# Patient Record
Sex: Male | Born: 1970 | Race: Black or African American | Hispanic: No | Marital: Married | Smoking: Current every day smoker
Health system: Southern US, Community
[De-identification: ages and names within clinical notes are randomized; demographics above are authoritative.]

## PROBLEM LIST (undated history)

## (undated) DIAGNOSIS — C801 Malignant (primary) neoplasm, unspecified: Secondary | ICD-10-CM

## (undated) DIAGNOSIS — F528 Other sexual dysfunction not due to a substance or known physiological condition: Secondary | ICD-10-CM

## (undated) DIAGNOSIS — E291 Testicular hypofunction: Secondary | ICD-10-CM

## (undated) DIAGNOSIS — K219 Gastro-esophageal reflux disease without esophagitis: Secondary | ICD-10-CM

## (undated) DIAGNOSIS — E782 Mixed hyperlipidemia: Secondary | ICD-10-CM

## (undated) DIAGNOSIS — A6000 Herpesviral infection of urogenital system, unspecified: Secondary | ICD-10-CM

## (undated) HISTORY — DX: Gastro-esophageal reflux disease without esophagitis: K21.9

## (undated) HISTORY — DX: Other sexual dysfunction not due to a substance or known physiological condition: F52.8

## (undated) HISTORY — PX: FOOT SURGERY: SHX648

## (undated) HISTORY — DX: Testicular hypofunction: E29.1

## (undated) HISTORY — DX: Herpesviral infection of urogenital system, unspecified: A60.00

## (undated) HISTORY — DX: Mixed hyperlipidemia: E78.2

## (undated) HISTORY — PX: OTHER SURGICAL HISTORY: SHX169

---

## 2001-06-10 ENCOUNTER — Emergency Department (HOSPITAL_COMMUNITY): Admission: EM | Admit: 2001-06-10 | Discharge: 2001-06-10 | Payer: Self-pay | Admitting: Emergency Medicine

## 2004-09-08 ENCOUNTER — Emergency Department (HOSPITAL_COMMUNITY): Admission: EM | Admit: 2004-09-08 | Discharge: 2004-09-09 | Payer: Self-pay | Admitting: Emergency Medicine

## 2004-10-16 ENCOUNTER — Ambulatory Visit (HOSPITAL_COMMUNITY): Admission: RE | Admit: 2004-10-16 | Discharge: 2004-10-16 | Payer: Self-pay | Admitting: Urology

## 2004-12-15 DIAGNOSIS — C8190 Hodgkin lymphoma, unspecified, unspecified site: Secondary | ICD-10-CM

## 2004-12-15 HISTORY — DX: Hodgkin lymphoma, unspecified, unspecified site: C81.90

## 2005-08-28 ENCOUNTER — Encounter: Admission: RE | Admit: 2005-08-28 | Discharge: 2005-08-28 | Payer: Self-pay | Admitting: Occupational Medicine

## 2005-09-12 ENCOUNTER — Ambulatory Visit: Payer: Self-pay | Admitting: Internal Medicine

## 2005-09-17 ENCOUNTER — Other Ambulatory Visit: Admission: RE | Admit: 2005-09-17 | Discharge: 2005-09-17 | Payer: Self-pay | Admitting: Otolaryngology

## 2005-09-28 DIAGNOSIS — C801 Malignant (primary) neoplasm, unspecified: Secondary | ICD-10-CM

## 2005-09-28 HISTORY — DX: Malignant (primary) neoplasm, unspecified: C80.1

## 2005-09-29 ENCOUNTER — Encounter (INDEPENDENT_AMBULATORY_CARE_PROVIDER_SITE_OTHER): Payer: Self-pay | Admitting: *Deleted

## 2005-09-29 ENCOUNTER — Ambulatory Visit (HOSPITAL_COMMUNITY): Admission: RE | Admit: 2005-09-29 | Discharge: 2005-09-29 | Payer: Self-pay | Admitting: Otolaryngology

## 2005-09-29 ENCOUNTER — Ambulatory Visit (HOSPITAL_BASED_OUTPATIENT_CLINIC_OR_DEPARTMENT_OTHER): Admission: RE | Admit: 2005-09-29 | Discharge: 2005-09-29 | Payer: Self-pay | Admitting: Otolaryngology

## 2005-10-03 ENCOUNTER — Ambulatory Visit: Payer: Self-pay | Admitting: Hematology and Oncology

## 2005-10-07 ENCOUNTER — Ambulatory Visit: Admission: RE | Admit: 2005-10-07 | Discharge: 2005-10-20 | Payer: Self-pay | Admitting: Radiation Oncology

## 2005-10-10 ENCOUNTER — Ambulatory Visit (HOSPITAL_COMMUNITY): Admission: RE | Admit: 2005-10-10 | Discharge: 2005-10-10 | Payer: Self-pay | Admitting: Hematology and Oncology

## 2005-10-13 ENCOUNTER — Ambulatory Visit (HOSPITAL_COMMUNITY): Admission: RE | Admit: 2005-10-13 | Discharge: 2005-10-13 | Payer: Self-pay | Admitting: Hematology and Oncology

## 2005-10-14 ENCOUNTER — Other Ambulatory Visit: Admission: RE | Admit: 2005-10-14 | Discharge: 2005-10-14 | Payer: Self-pay | Admitting: Hematology and Oncology

## 2005-10-14 ENCOUNTER — Encounter (INDEPENDENT_AMBULATORY_CARE_PROVIDER_SITE_OTHER): Payer: Self-pay | Admitting: *Deleted

## 2005-10-15 ENCOUNTER — Ambulatory Visit (HOSPITAL_COMMUNITY): Admission: RE | Admit: 2005-10-15 | Discharge: 2005-10-15 | Payer: Self-pay | Admitting: Hematology and Oncology

## 2005-11-25 ENCOUNTER — Ambulatory Visit: Payer: Self-pay | Admitting: Hematology and Oncology

## 2006-01-20 ENCOUNTER — Ambulatory Visit: Payer: Self-pay | Admitting: Hematology and Oncology

## 2006-02-23 ENCOUNTER — Ambulatory Visit: Admission: RE | Admit: 2006-02-23 | Discharge: 2006-05-24 | Payer: Self-pay | Admitting: Radiation Oncology

## 2006-03-02 ENCOUNTER — Ambulatory Visit: Payer: Self-pay | Admitting: Dentistry

## 2006-03-02 ENCOUNTER — Encounter: Admission: RE | Admit: 2006-03-02 | Discharge: 2006-03-02 | Payer: Self-pay | Admitting: Dentistry

## 2006-03-04 ENCOUNTER — Ambulatory Visit (HOSPITAL_COMMUNITY): Admission: RE | Admit: 2006-03-04 | Discharge: 2006-03-04 | Payer: Self-pay | Admitting: Radiation Oncology

## 2006-03-12 ENCOUNTER — Ambulatory Visit (HOSPITAL_COMMUNITY): Admission: RE | Admit: 2006-03-12 | Discharge: 2006-03-12 | Payer: Self-pay | Admitting: Thoracic Surgery

## 2006-03-13 ENCOUNTER — Ambulatory Visit (HOSPITAL_COMMUNITY): Admission: RE | Admit: 2006-03-13 | Discharge: 2006-03-13 | Payer: Self-pay | Admitting: Thoracic Surgery

## 2006-03-13 ENCOUNTER — Encounter (INDEPENDENT_AMBULATORY_CARE_PROVIDER_SITE_OTHER): Payer: Self-pay | Admitting: *Deleted

## 2006-04-02 ENCOUNTER — Encounter (INDEPENDENT_AMBULATORY_CARE_PROVIDER_SITE_OTHER): Payer: Self-pay | Admitting: Specialist

## 2006-04-02 ENCOUNTER — Inpatient Hospital Stay (HOSPITAL_COMMUNITY): Admission: RE | Admit: 2006-04-02 | Discharge: 2006-04-06 | Payer: Self-pay | Admitting: Thoracic Surgery

## 2006-04-03 ENCOUNTER — Ambulatory Visit: Payer: Self-pay | Admitting: Hematology and Oncology

## 2006-04-13 ENCOUNTER — Encounter: Admission: RE | Admit: 2006-04-13 | Discharge: 2006-04-13 | Payer: Self-pay | Admitting: Thoracic Surgery

## 2006-04-16 ENCOUNTER — Ambulatory Visit: Payer: Self-pay | Admitting: Hematology and Oncology

## 2006-04-16 LAB — LACTATE DEHYDROGENASE: LDH: 175 U/L (ref 94–250)

## 2006-04-16 LAB — CBC WITH DIFFERENTIAL/PLATELET
BASO%: 1 % (ref 0.0–2.0)
LYMPH%: 25.6 % (ref 14.0–48.0)
MCHC: 32.8 g/dL (ref 32.0–35.9)
MONO#: 0.4 10*3/uL (ref 0.1–0.9)
MONO%: 8.5 % (ref 0.0–13.0)
Platelets: 401 10*3/uL — ABNORMAL HIGH (ref 145–400)
RBC: 4.77 10*6/uL (ref 4.20–5.71)
RDW: 14.5 % (ref 11.2–14.6)
WBC: 5.3 10*3/uL (ref 4.0–10.0)

## 2006-04-16 LAB — COMPREHENSIVE METABOLIC PANEL
ALT: 38 U/L (ref 0–40)
Alkaline Phosphatase: 113 U/L (ref 39–117)
CO2: 27 mEq/L (ref 19–32)
Potassium: 4.9 mEq/L (ref 3.5–5.3)
Sodium: 141 mEq/L (ref 135–145)
Total Bilirubin: 0.3 mg/dL (ref 0.3–1.2)
Total Protein: 7.9 g/dL (ref 6.0–8.3)

## 2006-04-28 ENCOUNTER — Ambulatory Visit: Payer: Self-pay | Admitting: Critical Care Medicine

## 2006-04-29 ENCOUNTER — Encounter: Admission: RE | Admit: 2006-04-29 | Discharge: 2006-04-29 | Payer: Self-pay | Admitting: Thoracic Surgery

## 2006-05-04 ENCOUNTER — Ambulatory Visit: Payer: Self-pay | Admitting: Critical Care Medicine

## 2006-05-25 ENCOUNTER — Ambulatory Visit: Admission: RE | Admit: 2006-05-25 | Discharge: 2006-06-02 | Payer: Self-pay | Admitting: Hematology and Oncology

## 2006-07-15 ENCOUNTER — Encounter: Admission: RE | Admit: 2006-07-15 | Discharge: 2006-07-15 | Payer: Self-pay | Admitting: Thoracic Surgery

## 2006-08-19 ENCOUNTER — Ambulatory Visit: Payer: Self-pay | Admitting: Hematology and Oncology

## 2006-08-21 ENCOUNTER — Ambulatory Visit (HOSPITAL_COMMUNITY): Admission: RE | Admit: 2006-08-21 | Discharge: 2006-08-21 | Payer: Self-pay | Admitting: Hematology and Oncology

## 2006-08-21 LAB — COMPREHENSIVE METABOLIC PANEL
ALT: 40 U/L (ref 0–40)
AST: 33 U/L (ref 0–37)
Albumin: 4.7 g/dL (ref 3.5–5.2)
CO2: 24 mEq/L (ref 19–32)
Calcium: 9.9 mg/dL (ref 8.4–10.5)
Chloride: 106 mEq/L (ref 96–112)
Potassium: 4 mEq/L (ref 3.5–5.3)
Total Protein: 7.7 g/dL (ref 6.0–8.3)

## 2006-08-21 LAB — CBC WITH DIFFERENTIAL/PLATELET
BASO%: 0.7 % (ref 0.0–2.0)
EOS%: 1.1 % (ref 0.0–7.0)
Eosinophils Absolute: 0.1 10*3/uL (ref 0.0–0.5)
LYMPH%: 18.1 % (ref 14.0–48.0)
MCH: 28.3 pg (ref 28.0–33.4)
MCHC: 33.6 g/dL (ref 32.0–35.9)
MCV: 84.2 fL (ref 81.6–98.0)
MONO%: 8.9 % (ref 0.0–13.0)
Platelets: 259 10*3/uL (ref 145–400)
RBC: 4.98 10*6/uL (ref 4.20–5.71)
RDW: 16.1 % — ABNORMAL HIGH (ref 11.2–14.6)

## 2006-08-21 LAB — LACTATE DEHYDROGENASE: LDH: 195 U/L (ref 94–250)

## 2006-11-30 ENCOUNTER — Ambulatory Visit: Payer: Self-pay | Admitting: Hematology and Oncology

## 2006-11-30 ENCOUNTER — Ambulatory Visit (HOSPITAL_COMMUNITY): Admission: RE | Admit: 2006-11-30 | Discharge: 2006-11-30 | Payer: Self-pay | Admitting: Hematology and Oncology

## 2006-12-02 LAB — CBC WITH DIFFERENTIAL/PLATELET
Basophils Absolute: 0 10*3/uL (ref 0.0–0.1)
Eosinophils Absolute: 0.1 10*3/uL (ref 0.0–0.5)
HCT: 45.4 % (ref 38.7–49.9)
HGB: 15.2 g/dL (ref 13.0–17.1)
LYMPH%: 20.8 % (ref 14.0–48.0)
MCV: 85.2 fL (ref 81.6–98.0)
MONO%: 5.7 % (ref 0.0–13.0)
NEUT#: 3.2 10*3/uL (ref 1.5–6.5)
NEUT%: 70.6 % (ref 40.0–75.0)
Platelets: 251 10*3/uL (ref 145–400)

## 2006-12-02 LAB — TSH: TSH: 2.075 u[IU]/mL (ref 0.350–5.500)

## 2006-12-02 LAB — COMPREHENSIVE METABOLIC PANEL
Alkaline Phosphatase: 118 U/L — ABNORMAL HIGH (ref 39–117)
BUN: 21 mg/dL (ref 6–23)
Glucose, Bld: 106 mg/dL — ABNORMAL HIGH (ref 70–99)
Total Bilirubin: 0.5 mg/dL (ref 0.3–1.2)

## 2006-12-02 LAB — T4, FREE: Free T4: 1.1 ng/dL (ref 0.89–1.80)

## 2007-03-23 ENCOUNTER — Ambulatory Visit: Payer: Self-pay | Admitting: Hematology and Oncology

## 2007-03-26 LAB — COMPREHENSIVE METABOLIC PANEL
ALT: 50 U/L (ref 0–53)
AST: 26 U/L (ref 0–37)
Calcium: 9.6 mg/dL (ref 8.4–10.5)
Chloride: 105 mEq/L (ref 96–112)
Creatinine, Ser: 1.27 mg/dL (ref 0.40–1.50)
Sodium: 141 mEq/L (ref 135–145)
Total Protein: 7.4 g/dL (ref 6.0–8.3)

## 2007-03-26 LAB — CBC WITH DIFFERENTIAL/PLATELET
BASO%: 0.6 % (ref 0.0–2.0)
EOS%: 1.6 % (ref 0.0–7.0)
HCT: 41.7 % (ref 38.7–49.9)
MCH: 29.1 pg (ref 28.0–33.4)
MCHC: 34.8 g/dL (ref 32.0–35.9)
NEUT%: 66 % (ref 40.0–75.0)
RBC: 4.98 10*6/uL (ref 4.20–5.71)
RDW: 14.4 % (ref 11.2–14.6)
WBC: 5.3 10*3/uL (ref 4.0–10.0)
lymph#: 1.2 10*3/uL (ref 0.9–3.3)

## 2007-03-30 ENCOUNTER — Ambulatory Visit (HOSPITAL_COMMUNITY): Admission: RE | Admit: 2007-03-30 | Discharge: 2007-03-30 | Payer: Self-pay | Admitting: Hematology and Oncology

## 2007-07-23 ENCOUNTER — Ambulatory Visit: Payer: Self-pay | Admitting: Hematology and Oncology

## 2007-07-27 ENCOUNTER — Ambulatory Visit (HOSPITAL_COMMUNITY): Admission: RE | Admit: 2007-07-27 | Discharge: 2007-07-27 | Payer: Self-pay | Admitting: Hematology and Oncology

## 2007-08-03 LAB — CBC WITH DIFFERENTIAL/PLATELET
Basophils Absolute: 0.1 10*3/uL (ref 0.0–0.1)
EOS%: 1.6 % (ref 0.0–7.0)
HCT: 42.1 % (ref 38.7–49.9)
HGB: 14.8 g/dL (ref 13.0–17.1)
MCH: 29.9 pg (ref 28.0–33.4)
MCHC: 35.2 g/dL (ref 32.0–35.9)
MCV: 84.9 fL (ref 81.6–98.0)
MONO%: 7.5 % (ref 0.0–13.0)
NEUT%: 67 % (ref 40.0–75.0)
RDW: 14.5 % (ref 11.2–14.6)

## 2007-08-03 LAB — COMPREHENSIVE METABOLIC PANEL
ALT: 62 U/L — ABNORMAL HIGH (ref 0–53)
AST: 41 U/L — ABNORMAL HIGH (ref 0–37)
Albumin: 5.2 g/dL (ref 3.5–5.2)
Alkaline Phosphatase: 90 U/L (ref 39–117)
Calcium: 10.6 mg/dL — ABNORMAL HIGH (ref 8.4–10.5)
Chloride: 104 mEq/L (ref 96–112)
Potassium: 3.9 mEq/L (ref 3.5–5.3)
Sodium: 141 mEq/L (ref 135–145)
Total Protein: 8 g/dL (ref 6.0–8.3)

## 2007-10-11 ENCOUNTER — Encounter: Payer: Self-pay | Admitting: Internal Medicine

## 2007-10-11 ENCOUNTER — Ambulatory Visit: Payer: Self-pay | Admitting: Internal Medicine

## 2007-10-11 DIAGNOSIS — F528 Other sexual dysfunction not due to a substance or known physiological condition: Secondary | ICD-10-CM

## 2007-10-11 DIAGNOSIS — N529 Male erectile dysfunction, unspecified: Secondary | ICD-10-CM | POA: Insufficient documentation

## 2007-10-11 HISTORY — DX: Other sexual dysfunction not due to a substance or known physiological condition: F52.8

## 2007-10-13 ENCOUNTER — Telehealth (INDEPENDENT_AMBULATORY_CARE_PROVIDER_SITE_OTHER): Payer: Self-pay | Admitting: *Deleted

## 2007-10-13 LAB — CONVERTED CEMR LAB
ALT: 72 units/L — ABNORMAL HIGH (ref 0–53)
AST: 39 units/L — ABNORMAL HIGH (ref 0–37)
Albumin: 4.3 g/dL (ref 3.5–5.2)
Basophils Absolute: 0 10*3/uL (ref 0.0–0.1)
Calcium: 9.8 mg/dL (ref 8.4–10.5)
Chloride: 106 meq/L (ref 96–112)
Creatinine, Ser: 1 mg/dL (ref 0.4–1.5)
Eosinophils Relative: 1.8 % (ref 0.0–5.0)
GFR calc non Af Amer: 89 mL/min
Glucose, Bld: 96 mg/dL (ref 70–99)
HCT: 41.6 % (ref 39.0–52.0)
Neutrophils Relative %: 65.1 % (ref 43.0–77.0)
Nitrite: NEGATIVE
Platelets: 216 10*3/uL (ref 150–400)
RBC: 4.74 M/uL (ref 4.22–5.81)
RDW: 13.2 % (ref 11.5–14.6)
Sodium: 143 meq/L (ref 135–145)
Specific Gravity, Urine: 1.02 (ref 1.000–1.03)
Testosterone: 161.25 ng/dL — ABNORMAL LOW (ref 350.00–890)
Total Bilirubin: 0.8 mg/dL (ref 0.3–1.2)
Total CHOL/HDL Ratio: 5.9
Total Protein, Urine: NEGATIVE mg/dL
Triglycerides: 271 mg/dL (ref 0–149)
Urine Glucose: NEGATIVE mg/dL
Urobilinogen, UA: 0.2 (ref 0.0–1.0)
VLDL: 54 mg/dL — ABNORMAL HIGH (ref 0–40)
WBC: 5 10*3/uL (ref 4.5–10.5)
pH: 7 (ref 5.0–8.0)

## 2007-10-18 ENCOUNTER — Ambulatory Visit: Payer: Self-pay | Admitting: Internal Medicine

## 2007-10-18 DIAGNOSIS — E291 Testicular hypofunction: Secondary | ICD-10-CM

## 2007-10-18 DIAGNOSIS — E782 Mixed hyperlipidemia: Secondary | ICD-10-CM | POA: Insufficient documentation

## 2007-10-18 HISTORY — DX: Mixed hyperlipidemia: E78.2

## 2007-10-18 HISTORY — DX: Testicular hypofunction: E29.1

## 2007-10-20 ENCOUNTER — Encounter: Admission: RE | Admit: 2007-10-20 | Discharge: 2007-10-20 | Payer: Self-pay | Admitting: Internal Medicine

## 2007-11-24 IMAGING — CT CT CHEST W/ CM
2 of 8 series · 15 of 46 positions shown, 17 images · IV contrast (omnipaque)
Comparison: 08/21/06.

CLINICAL DATA: Hodgkin?s lymphoma diagnosed in Wednesday September, 2005.  Status post right lobectomy.  Chemotherapy complete.
NECK CT WITH CONTRAST:
TECHNIQUE: Multidetector CT imaging of the neck was performed following the standard protocol during administration of intravenous contrast.
Contrast:  125 cc Omnipaque 300.
TECHNIQUE: Multidetector CT imaging of the chest was performed following the standard protocol during bolus administration of intravenous contrast.
TECHNIQUE: Multidetector CT imaging of the abdomen was performed following the standard protocol during bolus administration of intravenous contrast.
TECHNIQUE: Multidetector CT imaging of the pelvis was performed following the standard protocol during bolus administration of intravenous contrast.

[Series 2: cap 5.0 b40f · axial · 0.80mm/px · z∈[-859,-274]mm · 12 of 139 slices shown, 14 images]
[im 11/139  soft-tissue]
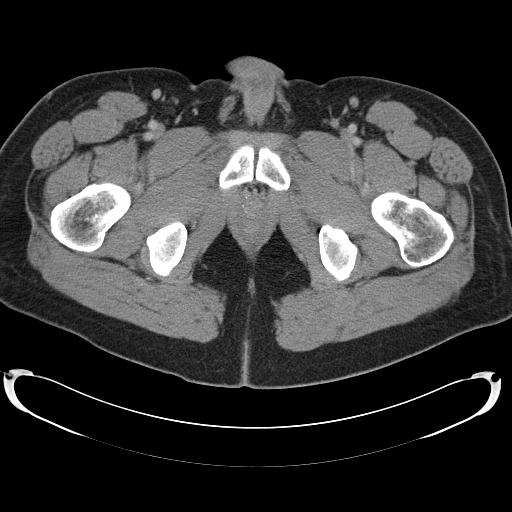
[im 11/139  bone]
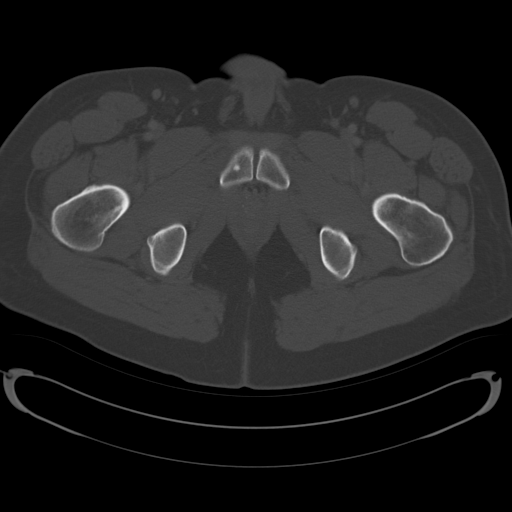
[im 22/139  soft-tissue]
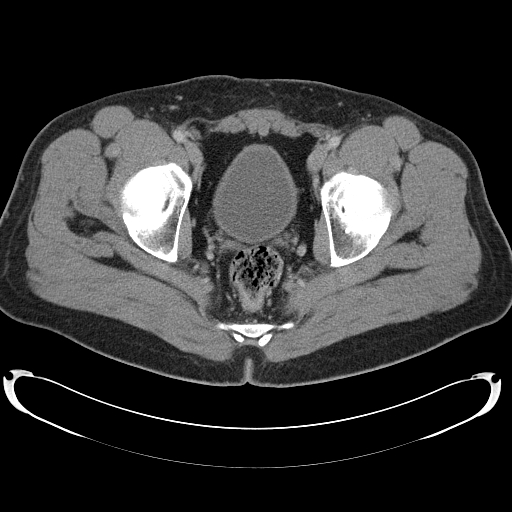
[im 32/139  soft-tissue]
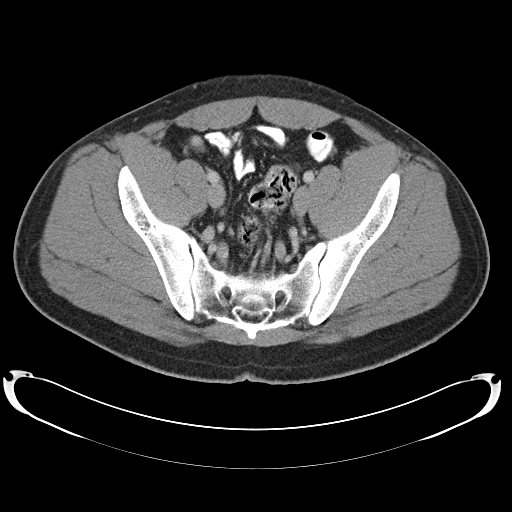
[im 43/139  soft-tissue]
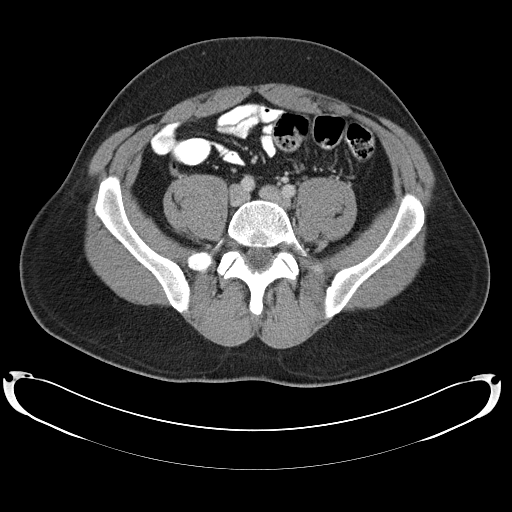
[im 54/139  soft-tissue]
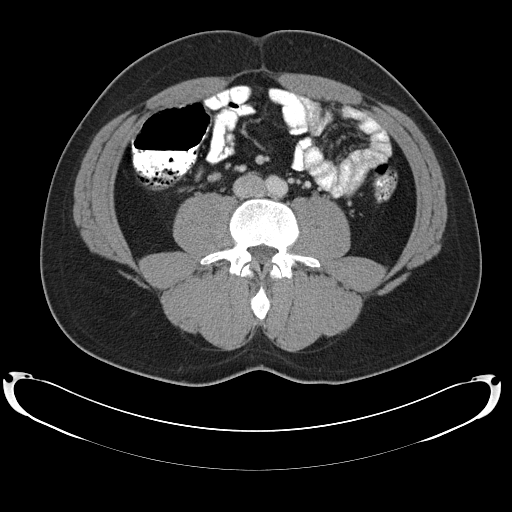
[im 64/139  soft-tissue]
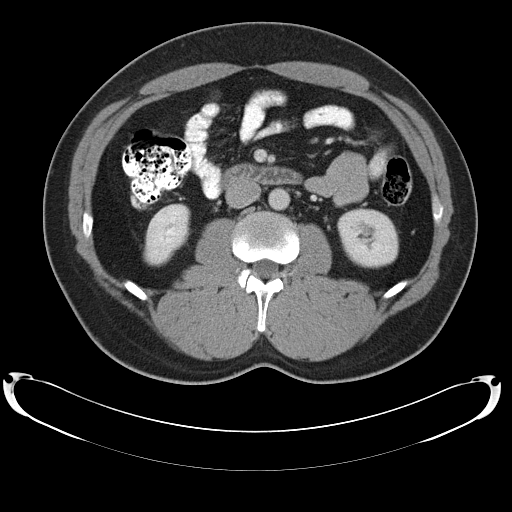
[im 75/139  soft-tissue]
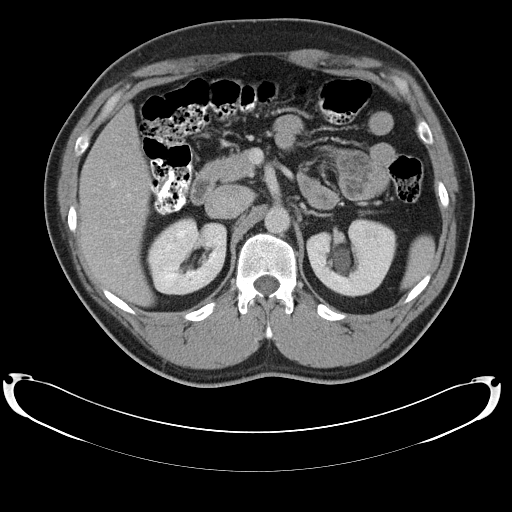
[im 85/139  soft-tissue]
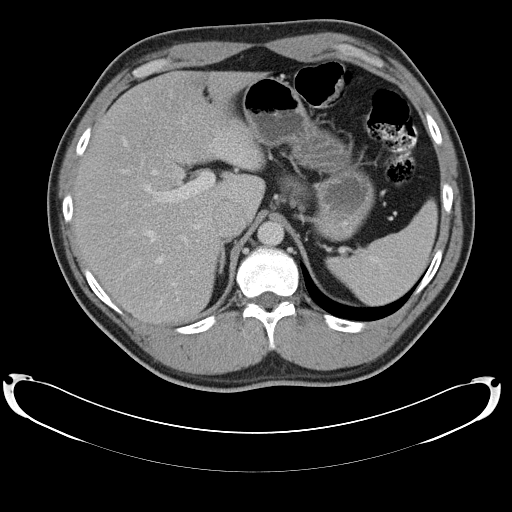
[im 96/139  soft-tissue]
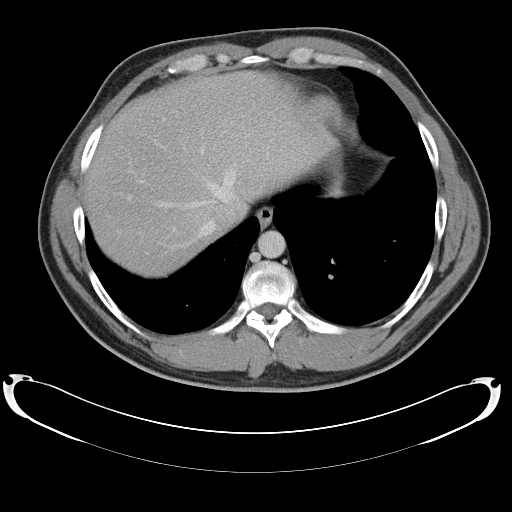
[im 96/139  bone]
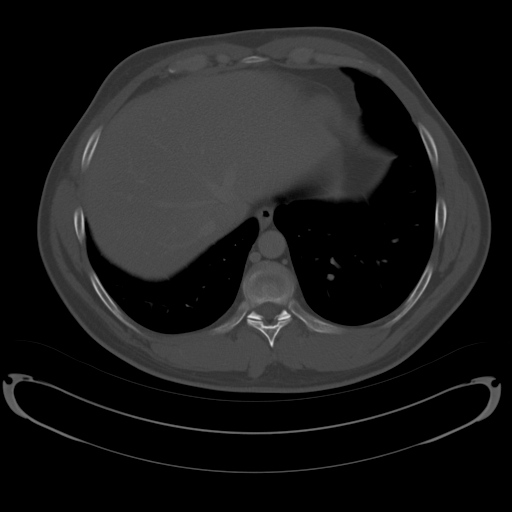
[im 107/139  soft-tissue]
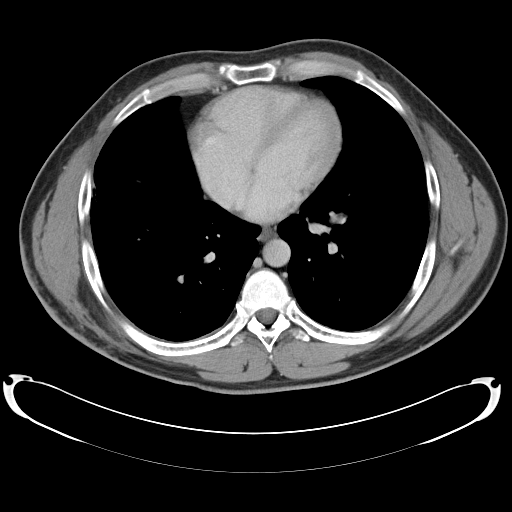
[im 117/139  soft-tissue]
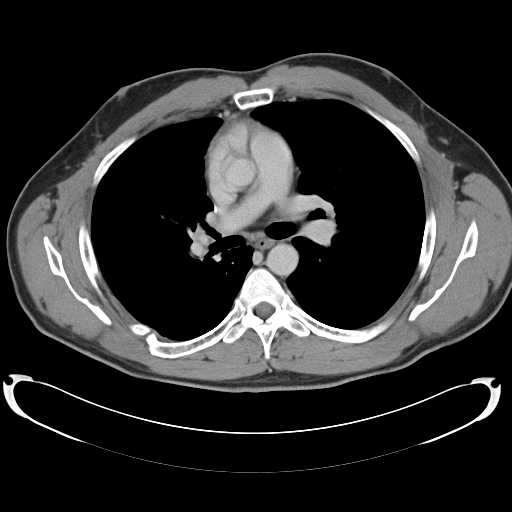
[im 128/139  soft-tissue]
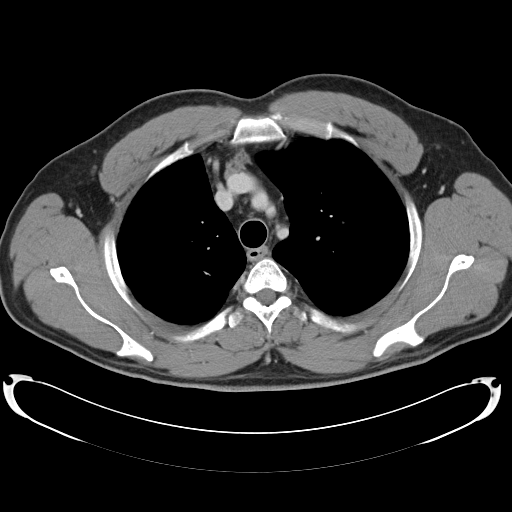

[Series 604: <mpr thick range(2)> · coronal · 1.35mm/px · 3 of 79 slices shown]
[im 16/79  soft-tissue]
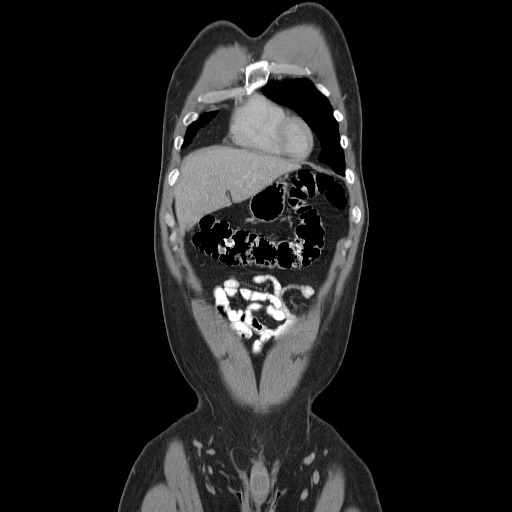
[im 32/79  soft-tissue]
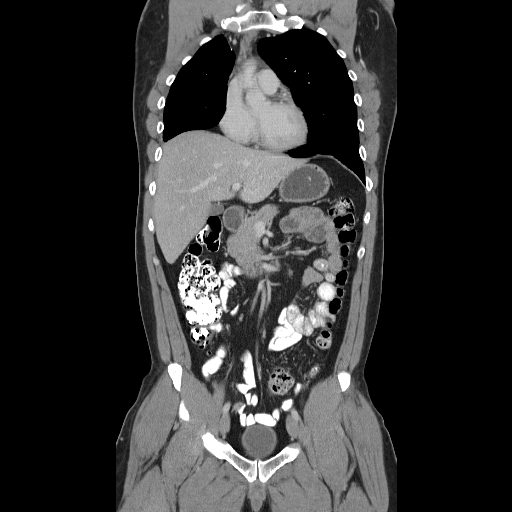
[im 47/79  soft-tissue]
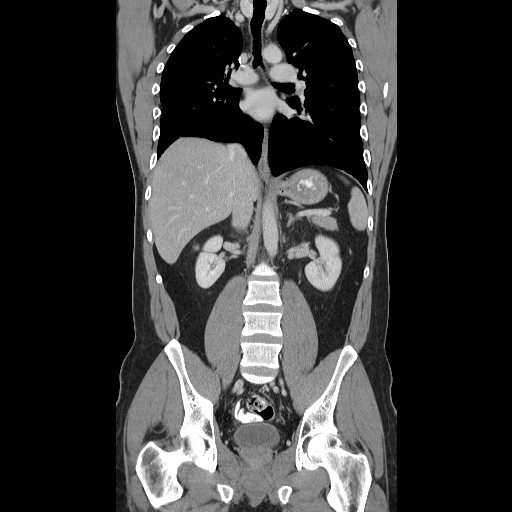

[15 of 46 positions shown; findings below may reference images not displayed]

FINDINGS: Limited intracranial imaging is normal. 
The orbits and globes are normal.  Nasopharyngeal soft tissues are borderline prominent.  This is similar to on the prior exam and likely a normal variation for this patient's age.  Subtle asymmetry of the palatine tonsil soft tissues with relative fullness on the left (image 35 through 37 of series 4).  
Relative fullness at the left glossopalatine sulcus (image 49) could be due to underdistention.
The larynx is normal. 
Thyroid gland has a normal appearance.  The submandibular glands remain asymmetric.
There is a right jugulodigastric lymph node, which measures approximately 1.0 x 0.8 cm compared with 1.3 x 0.8 previously (image 49).  A left-sided jugulodigastric node on image 43 is also slightly smaller than on the prior exam and not pathologically enlarged by size criteria.  No new or enlarging lymph nodes are identified.
IMPRESSION: 1.  No evidence of lymphadenopathy within the neck.  A right jugulodigastric node is further decreased in size. 
2.  The submandibular glands remain asymmetric.  This is felt to most likely a normal variation for this patient. 
3.  Asymmetric left palatine tonsil region could be due to underdistention.  Involvement of Waldeyer?s ring cannot be entirely excluded.  CT PET may be useful for further evaluation if indicated. 
CHEST CT WITH CONTRAST:
FINDINGS: Lung windows demonstrate postsurgical changes in the right upper lobe.  Lungs are clear.  
Soft tissue windows demonstrate normal heart size without pericardial or pleural effusion.  No mediastinal adenopathy.  No hilar adenopathy.  There is subtle relative hypoattenuation within the left lower lobar pulmonary artery on image 25.  This persists on thin-sliced and reformat images.
Stable soft tissue attenuation in the anterior mediastinum likely relates to residual thymus.
IMPRESSION: 1.  Postsurgical changes on the right without evidence of progressive or active disease. 
2.  Extremely subtle low density in the left pulmonary artery branch on this nondedicated PE study.  If there are any left-sided chest complaints, dedicated imaging with CTA is recommended to exclude a pulmonary embolus.  If there are no chest complaints, attention to this area is recommended on follow-up exam.  
  ABDOMEN CT WITH CONTRAST:
FINDINGS: The liver is of mildly decreased density, suggestive of fatty infiltration. 
The spleen is normal in size and morphology.
The stomach, pancreas, gallbladder, adrenal glands, and right kidney are normal.  There are left-sided renal cysts, but no hydronephrosis.  
There is no retroperitoneal or retrocrural adenopathy. 
Small bowel is normal.  There is no ascites.
IMPRESSION: 1.  No acute process or evidence of active disease in the abdomen. 
2.  Question fatty infiltration of the liver. 
PELVIS CT WITH CONTRAST:
FINDINGS: Pelvic large and small bowel are normal.  No pelvic adenopathy or ascites.  The urinary bladder and prostate are normal. 
Bone windows demonstrate no worrisome osseous lesion.
IMPRESSION: No active disease or evidence of metastatic disease within the pelvis.

## 2008-01-09 ENCOUNTER — Emergency Department (HOSPITAL_COMMUNITY): Admission: EM | Admit: 2008-01-09 | Discharge: 2008-01-09 | Payer: Self-pay | Admitting: Family Medicine

## 2008-01-27 ENCOUNTER — Ambulatory Visit: Payer: Self-pay | Admitting: Hematology and Oncology

## 2008-01-31 ENCOUNTER — Ambulatory Visit (HOSPITAL_COMMUNITY): Admission: RE | Admit: 2008-01-31 | Discharge: 2008-01-31 | Payer: Self-pay | Admitting: Hematology and Oncology

## 2008-01-31 LAB — CBC WITH DIFFERENTIAL/PLATELET
Basophils Absolute: 0 10*3/uL (ref 0.0–0.1)
EOS%: 1.7 % (ref 0.0–7.0)
HGB: 14.2 g/dL (ref 13.0–17.1)
MCH: 28.8 pg (ref 28.0–33.4)
MONO%: 9.4 % (ref 0.0–13.0)
NEUT#: 2.6 10*3/uL (ref 1.5–6.5)
RBC: 4.95 10*6/uL (ref 4.20–5.71)
RDW: 14.1 % (ref 11.2–14.6)
lymph#: 1.3 10*3/uL (ref 0.9–3.3)

## 2008-01-31 LAB — COMPREHENSIVE METABOLIC PANEL
ALT: 61 U/L — ABNORMAL HIGH (ref 0–53)
AST: 35 U/L (ref 0–37)
Albumin: 4.8 g/dL (ref 3.5–5.2)
Alkaline Phosphatase: 76 U/L (ref 39–117)
BUN: 17 mg/dL (ref 6–23)
Calcium: 9.7 mg/dL (ref 8.4–10.5)
Chloride: 105 mEq/L (ref 96–112)
Potassium: 4.1 mEq/L (ref 3.5–5.3)
Sodium: 141 mEq/L (ref 135–145)
Total Protein: 7.8 g/dL (ref 6.0–8.3)

## 2008-02-03 ENCOUNTER — Encounter: Payer: Self-pay | Admitting: Internal Medicine

## 2008-06-08 ENCOUNTER — Ambulatory Visit: Payer: Self-pay | Admitting: Internal Medicine

## 2008-06-08 DIAGNOSIS — A6000 Herpesviral infection of urogenital system, unspecified: Secondary | ICD-10-CM

## 2008-06-08 HISTORY — DX: Herpesviral infection of urogenital system, unspecified: A60.00

## 2008-09-19 ENCOUNTER — Ambulatory Visit: Payer: Self-pay | Admitting: Hematology and Oncology

## 2008-09-21 ENCOUNTER — Ambulatory Visit (HOSPITAL_COMMUNITY): Admission: RE | Admit: 2008-09-21 | Discharge: 2008-09-21 | Payer: Self-pay | Admitting: Hematology and Oncology

## 2008-09-21 LAB — COMPREHENSIVE METABOLIC PANEL WITH GFR
ALT: 62 U/L — ABNORMAL HIGH (ref 0–53)
AST: 41 U/L — ABNORMAL HIGH (ref 0–37)
Albumin: 4.3 g/dL (ref 3.5–5.2)
Alkaline Phosphatase: 76 U/L (ref 39–117)
BUN: 19 mg/dL (ref 6–23)
CO2: 29 meq/L (ref 19–32)
Calcium: 9.9 mg/dL (ref 8.4–10.5)
Chloride: 102 meq/L (ref 96–112)
Creatinine, Ser: 1.02 mg/dL (ref 0.40–1.50)
Glucose, Bld: 95 mg/dL (ref 70–99)
Potassium: 4 meq/L (ref 3.5–5.3)
Sodium: 139 meq/L (ref 135–145)
Total Bilirubin: 0.6 mg/dL (ref 0.3–1.2)
Total Protein: 7.2 g/dL (ref 6.0–8.3)

## 2008-09-21 LAB — CBC WITH DIFFERENTIAL/PLATELET
Eosinophils Absolute: 0.1 10*3/uL (ref 0.0–0.5)
MCH: 28.6 pg (ref 28.0–33.4)
MCHC: 33.3 g/dL (ref 32.0–35.9)
MCV: 85.9 fL (ref 81.6–98.0)
Platelets: 201 10*3/uL (ref 145–400)
RBC: 4.73 10*6/uL (ref 4.20–5.71)
RDW: 13.6 % (ref 11.2–14.6)

## 2008-09-21 LAB — LACTATE DEHYDROGENASE: LDH: 162 U/L (ref 94–250)

## 2008-09-28 ENCOUNTER — Encounter: Payer: Self-pay | Admitting: Internal Medicine

## 2008-10-06 ENCOUNTER — Ambulatory Visit: Payer: Self-pay | Admitting: Internal Medicine

## 2008-10-10 LAB — CONVERTED CEMR LAB
ALT: 58 units/L — ABNORMAL HIGH (ref 0–53)
Alkaline Phosphatase: 88 units/L (ref 39–117)
Basophils Absolute: 0 10*3/uL (ref 0.0–0.1)
Bilirubin Urine: NEGATIVE
Bilirubin, Direct: 0.2 mg/dL (ref 0.0–0.3)
CO2: 30 meq/L (ref 19–32)
Calcium: 9.4 mg/dL (ref 8.4–10.5)
Chloride: 104 meq/L (ref 96–112)
Cholesterol: 207 mg/dL (ref 0–200)
Glucose, Bld: 92 mg/dL (ref 70–99)
Hemoglobin, Urine: NEGATIVE
Ketones, ur: NEGATIVE mg/dL
Leukocytes, UA: NEGATIVE
Lymphocytes Relative: 35 % (ref 12.0–46.0)
MCHC: 34.3 g/dL (ref 30.0–36.0)
Monocytes Relative: 6.8 % (ref 3.0–12.0)
Neutro Abs: 2.9 10*3/uL (ref 1.4–7.7)
Neutrophils Relative %: 55.5 % (ref 43.0–77.0)
Nitrite: NEGATIVE
Platelets: 221 10*3/uL (ref 150–400)
Potassium: 4.5 meq/L (ref 3.5–5.1)
RDW: 13.1 % (ref 11.5–14.6)
Sodium: 139 meq/L (ref 135–145)
TSH: 1.61 microintl units/mL (ref 0.35–5.50)
Testosterone Free: 74.6 pg/mL (ref 47.0–244.0)
Testosterone-% Free: 3.1 % — ABNORMAL HIGH (ref 1.6–2.9)
Total Bilirubin: 1 mg/dL (ref 0.3–1.2)
Total CHOL/HDL Ratio: 6.1
Triglycerides: 162 mg/dL — ABNORMAL HIGH (ref 0–149)
Urobilinogen, UA: 0.2 (ref 0.0–1.0)
VLDL: 32 mg/dL (ref 0–40)
pH: 6 (ref 5.0–8.0)

## 2008-12-25 ENCOUNTER — Ambulatory Visit: Payer: Self-pay | Admitting: Internal Medicine

## 2008-12-25 DIAGNOSIS — J019 Acute sinusitis, unspecified: Secondary | ICD-10-CM | POA: Insufficient documentation

## 2008-12-28 ENCOUNTER — Emergency Department (HOSPITAL_COMMUNITY): Admission: EM | Admit: 2008-12-28 | Discharge: 2008-12-28 | Payer: Self-pay | Admitting: Family Medicine

## 2009-07-23 ENCOUNTER — Ambulatory Visit: Payer: Self-pay | Admitting: Hematology and Oncology

## 2009-07-30 ENCOUNTER — Ambulatory Visit (HOSPITAL_COMMUNITY): Admission: RE | Admit: 2009-07-30 | Discharge: 2009-07-30 | Payer: Self-pay | Admitting: Hematology and Oncology

## 2009-07-30 LAB — COMPREHENSIVE METABOLIC PANEL
ALT: 67 U/L — ABNORMAL HIGH (ref 0–53)
AST: 42 U/L — ABNORMAL HIGH (ref 0–37)
Albumin: 3.9 g/dL (ref 3.5–5.2)
CO2: 27 mEq/L (ref 19–32)
Calcium: 8.9 mg/dL (ref 8.4–10.5)
Chloride: 108 mEq/L (ref 96–112)
Potassium: 3.9 mEq/L (ref 3.5–5.3)
Sodium: 139 mEq/L (ref 135–145)
Total Protein: 6.8 g/dL (ref 6.0–8.3)

## 2009-07-30 LAB — CBC WITH DIFFERENTIAL/PLATELET
BASO%: 0.6 % (ref 0.0–2.0)
HCT: 40.6 % (ref 38.4–49.9)
MCHC: 33.8 g/dL (ref 32.0–36.0)
MONO#: 0.3 10*3/uL (ref 0.1–0.9)
NEUT%: 67.5 % (ref 39.0–75.0)
RDW: 14 % (ref 11.0–14.6)
WBC: 4.8 10*3/uL (ref 4.0–10.3)
lymph#: 1.1 10*3/uL (ref 0.9–3.3)

## 2009-08-01 ENCOUNTER — Encounter: Payer: Self-pay | Admitting: Critical Care Medicine

## 2010-07-24 ENCOUNTER — Ambulatory Visit: Payer: Self-pay | Admitting: Hematology and Oncology

## 2010-07-30 ENCOUNTER — Encounter: Payer: Self-pay | Admitting: Internal Medicine

## 2010-07-30 ENCOUNTER — Ambulatory Visit (HOSPITAL_COMMUNITY): Admission: RE | Admit: 2010-07-30 | Discharge: 2010-07-30 | Payer: Self-pay | Admitting: Hematology and Oncology

## 2010-07-30 LAB — CBC WITH DIFFERENTIAL/PLATELET
BASO%: 1.4 % (ref 0.0–2.0)
HCT: 41.9 % (ref 38.4–49.9)
LYMPH%: 27.8 % (ref 14.0–49.0)
MCHC: 33.9 g/dL (ref 32.0–36.0)
MONO#: 0.5 10*3/uL (ref 0.1–0.9)
NEUT%: 57.9 % (ref 39.0–75.0)
Platelets: 220 10*3/uL (ref 140–400)
WBC: 4.9 10*3/uL (ref 4.0–10.3)

## 2010-07-30 LAB — LACTATE DEHYDROGENASE: LDH: 143 U/L (ref 94–250)

## 2010-07-30 LAB — COMPREHENSIVE METABOLIC PANEL
BUN: 16 mg/dL (ref 6–23)
CO2: 27 mEq/L (ref 19–32)
Creatinine, Ser: 1.07 mg/dL (ref 0.40–1.50)
Glucose, Bld: 100 mg/dL — ABNORMAL HIGH (ref 70–99)
Total Bilirubin: 0.8 mg/dL (ref 0.3–1.2)

## 2010-08-05 ENCOUNTER — Ambulatory Visit: Payer: Self-pay | Admitting: Internal Medicine

## 2010-08-05 DIAGNOSIS — N529 Male erectile dysfunction, unspecified: Secondary | ICD-10-CM | POA: Insufficient documentation

## 2010-08-05 LAB — CONVERTED CEMR LAB
ALT: 57 units/L — ABNORMAL HIGH (ref 0–53)
AST: 32 units/L (ref 0–37)
Alkaline Phosphatase: 81 units/L (ref 39–117)
Basophils Relative: 0.7 % (ref 0.0–3.0)
Bilirubin, Direct: 0.1 mg/dL (ref 0.0–0.3)
Calcium: 9.7 mg/dL (ref 8.4–10.5)
Chloride: 108 meq/L (ref 96–112)
Creatinine, Ser: 0.9 mg/dL (ref 0.4–1.5)
Direct LDL: 159.3 mg/dL
Eosinophils Relative: 1.8 % (ref 0.0–5.0)
Hemoglobin: 15 g/dL (ref 13.0–17.0)
Ketones, ur: NEGATIVE mg/dL
Leukocytes, UA: NEGATIVE
Lymphocytes Relative: 30.3 % (ref 12.0–46.0)
Neutrophils Relative %: 59.8 % (ref 43.0–77.0)
PSA: 0.6 ng/mL (ref 0.10–4.00)
RBC: 4.88 M/uL (ref 4.22–5.81)
Sodium: 142 meq/L (ref 135–145)
Specific Gravity, Urine: >=1.03 (ref 1.000–1.030)
TSH: 2 microintl units/mL (ref 0.35–5.50)
Total Protein: 7 g/dL (ref 6.0–8.3)
Urobilinogen, UA: 0.2 (ref 0.0–1.0)
WBC: 5 10*3/uL (ref 4.5–10.5)

## 2010-08-06 LAB — CONVERTED CEMR LAB: Testosterone: 240.87 ng/dL — ABNORMAL LOW (ref 350–890)

## 2011-01-04 ENCOUNTER — Encounter: Payer: Self-pay | Admitting: Internal Medicine

## 2011-01-04 ENCOUNTER — Other Ambulatory Visit: Payer: Self-pay | Admitting: Hematology and Oncology

## 2011-01-05 ENCOUNTER — Encounter: Payer: Self-pay | Admitting: Thoracic Surgery

## 2011-01-05 ENCOUNTER — Encounter: Payer: Self-pay | Admitting: Hematology and Oncology

## 2011-01-06 ENCOUNTER — Encounter: Payer: Self-pay | Admitting: Hematology and Oncology

## 2011-01-14 NOTE — Assessment & Plan Note (Signed)
Summary: FU Natale Milch   Vital Signs:  Patient profile:   40 year old male Height:      75 inches Weight:      263.50 pounds BMI:     33.05 O2 Sat:      97 % on Room air Temp:     98.1 degrees F oral Pulse rate:   60 / minute BP sitting:   106 / 62  (left arm) Cuff size:   large  Vitals Entered By: Zella Ball Ewing CMA Duncan Dull) (August 05, 2010 8:47 AM)  O2 Flow:  Room air  Preventive Care Screening     declines today  CC: Followup/RE   CC:  Followup/RE.  History of Present Illness:  overall doing well; no complaints,  saw oncology aug 18 with recent CT scans without disease recurrence.;  Pt denies CP, worsening sob, doe, wheezing, orthopnea, pnd, worsening LE edema, palps, dizziness or syncope  Pt denies new neuro symptoms such as headache, facial or extremity weakness  No fever, wt loss, night sweats, loss of appetite or other constitutional symptoms Needs Med refills.  No recent genital herpes rash/  Preventive Screening-Counseling & Management      Drug Use:  no.    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 1po Once Daily As Needed 2)  Acyclovir 400 Mg  Tabs (Acyclovir) .Marland Kitchen.. 1 By Mouth Two Times A Day 3)  Cephalexin 500 Mg Tabs (Cephalexin) .Marland Kitchen.. 1 By Mouth Three Times A Day  Current Medications (verified): 1)  Cialis 20 Mg Tabs (Tadalafil) .... Use Asd 1 By Mouth Every Other Day As Needed 2)  Acyclovir 400 Mg  Tabs (Acyclovir) .Marland Kitchen.. 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 hypogonadism ED  Past Surgical History: Reviewed history from 10/11/2007 and no changes required. s/p VATS procedure for organizing chronic inflammation RUL lumg  Social History: Reviewed history from 12/25/2008 and no changes required. 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         all otherwise negative per pt -    Physical Exam  General:  alert and overweight-appearing.   Head:  normocephalic and atraumatic.   Eyes:  vision grossly intact, pupils equal, and pupils round.   Ears:  R ear normal and L ear  normal.   Nose:  no external deformity and no nasal discharge.   Mouth:  no gingival abnormalities and pharynx pink and moist.   Neck:  supple and no masses.   Lungs:  normal respiratory effort and normal breath sounds.   Heart:  normal rate and regular rhythm.   Abdomen:  soft, non-tender, and normal bowel sounds.   Msk:  no joint tenderness and no joint swelling.   Extremities:  no edema, no erythema  Neurologic:  cranial nerves II-XII intact and strength normal  in all extremities.   Cervical Nodes:  No lymphadenopathy noted Axillary Nodes:  No palpable lymphadenopathy Psych:  not anxious appearing and not depressed appearing.     Impression & Recommendations:  Problem # 1:  Preventive Health Care (ICD-V70.0)  Overall doing well, age appropriate education and counseling updated and referral for appropriate preventive services done unless declined, immunizations up to date or declined, diet counseling done if overweight, urged to quit smoking if smokes , most recent labs reviewed and current ordered if appropriate, ecg reviewed or declined (interpretation per ECG scanned in the EMR if done); information regarding Medicare Prevention requirements given if appropriate; speciality referrals updated as appropriate   Orders: TLB-BMP (Basic Metabolic Panel-BMET) (80048-METABOL) TLB-CBC Platelet - w/Differential (85025-CBCD) TLB-Hepatic/Liver Function Pnl (80076-HEPATIC) TLB-Lipid Panel (80061-LIPID) TLB-TSH (Thyroid Stimulating Hormone) (84443-TSH) TLB-PSA (Prostate Specific Antigen) (84153-PSA) TLB-Udip ONLY (81003-UDIP)  Complete Medication List: 1)  Cialis 20 Mg Tabs (Tadalafil) .... Use asd 1 by mouth every other day as needed 2)  Acyclovir 400 Mg Tabs (Acyclovir) .Marland Kitchen.. 1 by mouth two times a day  Other Orders: T-Testosterone, Free and Total [id number])  Patient Instructions: 1)  Please take all new medications as prescribed - the cialis 2)  Continue all previous medications as before this visit  3)  All of your prescriptions were sent on the computer to the pharmacy 4)  Please go to the Lab in the basement for your blood and/or urine tests today 5)  Please call the number on the [facility] Card for results of your testing  6)  Please schedule a follow-up appointment in 1 year or sooner if needed Prescriptions: CIALIS 20 MG TABS (TADALAFIL) use asd 1 by mouth every other day as needed  #5 x 11   Entered and Authorized by:   Corwin Levins  MD   Signed by:   Corwin Levins MD on 08/05/2010   Method used:   Electronically to        [facility] # [id number]* (retail)       [address]       [place], [place]  [address]       Ph: [phone]       Fax: [phone]   RxID:   [id number] ACYCLOVIR 400 MG  TABS (ACYCLOVIR) 1 by mouth two times a day  #60 x 11   Entered and Authorized by:   Corwin Levins MD   Signed by:   Corwin Levins MD on 08/05/2010   Method used:   Electronically to        [facility] # [id number]* (retail)       [address]       [place], [place]  [address]       Ph: [phone]       Fax: [phone]   RxID:   [id number]

## 2011-01-14 NOTE — Letter (Signed)
[facility]   Imported By: Lennie Odor 08/14/2010 14:11:44  _____________________________________________________________________  External Attachment:    Type:   Image     Comment:   External Document

## 2011-02-14 ENCOUNTER — Telehealth: Payer: Self-pay | Admitting: Internal Medicine

## 2011-02-17 ENCOUNTER — Ambulatory Visit (INDEPENDENT_AMBULATORY_CARE_PROVIDER_SITE_OTHER): Payer: BC Managed Care – PPO | Admitting: Internal Medicine

## 2011-02-17 ENCOUNTER — Other Ambulatory Visit: Payer: Self-pay | Admitting: Internal Medicine

## 2011-02-17 ENCOUNTER — Encounter: Payer: Self-pay | Admitting: Internal Medicine

## 2011-02-17 ENCOUNTER — Ambulatory Visit (INDEPENDENT_AMBULATORY_CARE_PROVIDER_SITE_OTHER)
Admission: RE | Admit: 2011-02-17 | Discharge: 2011-02-17 | Disposition: A | Discharge status: Home / Self Care | Payer: BC Managed Care – PPO | Source: Ambulatory Visit | Attending: Internal Medicine | Admitting: Internal Medicine

## 2011-02-17 ENCOUNTER — Other Ambulatory Visit: Payer: BC Managed Care – PPO

## 2011-02-17 DIAGNOSIS — R0989 Other specified symptoms and signs involving the circulatory and respiratory systems: Secondary | ICD-10-CM

## 2011-02-17 DIAGNOSIS — R0609 Other forms of dyspnea: Secondary | ICD-10-CM

## 2011-02-17 LAB — BASIC METABOLIC PANEL
CO2: 30 mEq/L (ref 19–32)
Chloride: 102 mEq/L (ref 96–112)
Potassium: 4.6 mEq/L (ref 3.5–5.1)
Sodium: 137 mEq/L (ref 135–145)

## 2011-02-17 LAB — CBC WITH DIFFERENTIAL/PLATELET
Basophils Relative: 0.4 % (ref 0.0–3.0)
Eosinophils Absolute: 0.1 10*3/uL (ref 0.0–0.7)
Hemoglobin: 15 g/dL (ref 13.0–17.0)
MCHC: 34.1 g/dL (ref 30.0–36.0)
MCV: 87.9 fl (ref 78.0–100.0)
Monocytes Absolute: 0.6 10*3/uL (ref 0.1–1.0)
Neutro Abs: 3.6 10*3/uL (ref 1.4–7.7)
RBC: 5 Mil/uL (ref 4.22–5.81)

## 2011-02-17 LAB — HEPATIC FUNCTION PANEL
ALT: 73 U/L — ABNORMAL HIGH (ref 0–53)
Albumin: 4.3 g/dL (ref 3.5–5.2)
Alkaline Phosphatase: 83 U/L (ref 39–117)
Total Protein: 7 g/dL (ref 6.0–8.3)

## 2011-02-20 ENCOUNTER — Encounter (INDEPENDENT_AMBULATORY_CARE_PROVIDER_SITE_OTHER): Payer: BC Managed Care – PPO

## 2011-02-20 ENCOUNTER — Encounter: Payer: Self-pay | Admitting: Internal Medicine

## 2011-02-20 DIAGNOSIS — R0609 Other forms of dyspnea: Secondary | ICD-10-CM

## 2011-02-20 DIAGNOSIS — R0989 Other specified symptoms and signs involving the circulatory and respiratory systems: Secondary | ICD-10-CM

## 2011-02-20 NOTE — Progress Notes (Signed)
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 No CP and symptoms have been ongoing xmths.  Initial call taken by: Lamar Sprinkles, CMA,  February 14, 2011 4:08 PM  Follow-up for Phone Call        ok for OV next available  to urgent care or ER if increased symptoms, fever, CP, dizziness, wheezing  over the weekend Follow-up by: Corwin Levins MD,  February 14, 2011 4:29 PM  Additional Follow-up for Phone Call Additional follow up Details #1::        pt advised via home VM. Told to call back for OV until 6pm or after hours for possible sat appt. Additional Follow-up by: Margaret Pyle, CMA,  February 14, 2011 4:50 PM

## 2011-02-21 ENCOUNTER — Encounter: Payer: Self-pay | Admitting: Internal Medicine

## 2011-02-24 ENCOUNTER — Other Ambulatory Visit (HOSPITAL_COMMUNITY): Payer: BC Managed Care – PPO

## 2011-02-25 NOTE — Assessment & Plan Note (Signed)
Summary: SHORTNESS OF BREATH/LB   Vital Signs:  Patient profile:   40 year old male Height:      74 inches Weight:      267.13 pounds BMI:     34.42 O2 Sat:      97 % on Room air Temp:     99.5 degrees F oral Pulse rate:   64 / minute BP sitting:   108 / 78  (left arm) Cuff size:   large  Vitals Entered By: Zella Ball Ewing CMA Duncan Dull) (February 17, 2011 3:49 PM)  O2 Flow:  Room air CC: SOB/RE   CC:  SOB/RE.  History of Present Illness: here with subacute symptoms  - here with 4 mo new onset sob/doe with wheezing and exercise intolerance - now down to less than 50 yds running ability before  onset symptoms (though can walk slowly without sob for same distance);  3 wks ago was in the woods visiting a friend, chopped some wood but only could chop for 3-4 min before breathing hard; Pt denies CP,  orthopnea, pnd, worsening LE edema, palps, dizziness or syncope, or cough . Did have URI symtpoms last wk with some low grade temp.but since resolved.   Pt denies new neuro symptoms such as headache, facial or extremity weakness  Pt denies polydipsia, polyuria  Overall good compliance with meds, trying to follow low chol  diet, wt stable, little excercise however  Is s/p right VATS procedure right chest, no recnet bleeding or bruising.  No hx of asthma, PE .  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 Use Asd 1 By Mouth Every Other Day As Needed 2)  Acyclovir 400 Mg  Tabs (Acyclovir) .Marland Kitchen.. 1 By Mouth Two Times A Day  Current Medications (verified): 1)  Cialis 20 Mg Tabs (Tadalafil) .... Use Asd 1 By Mouth Every Other Day As Needed 2)  Acyclovir 400 Mg  Tabs (Acyclovir) .Marland Kitchen.. 1 By Mouth Two Times A Day 3)  Dulera 100-5 Mcg/act Aero (Mometasone Furo-Formoterol Fum) .... 2 Puffs Two Times A Day  Allergies (verified): No Known Drug Allergies  Past History:  Past Medical History: Last updated: 08/05/2010 hodgkin's lymphoma  s/p cmt and xrt genital herpes fatty liver/hepatic stetosis ? 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   Head:  normocephalic and atraumatic.   Eyes:  vision grossly intact, pupils equal, and pupils round.   Ears:  R ear normal and L ear normal.   Nose:  no external deformity and no nasal discharge.   Mouth:  no gingival abnormalities and pharynx pink and moist.   Neck:  supple and no masses.   Lungs:  normal respiratory effort and normal breath sounds.   Heart:  normal rate and regular rhythm.   Abdomen:  soft, non-tender, and normal bowel sounds.   Msk:  no joint tenderness and no joint swelling.   Extremities:  no edema, no erythema  Neurologic:  cranial nerves II-XII intact and strength normal in all extremities.   Skin:  color normal and no rashes.   Psych:  not depressed appearing and slightly anxious.     Impression & Recommendations:  Problem # 1:  DYSPNEA ON EXERTION (ICD-786.09) Assessment  New  unusual several months worsening with hx of lymphoma, VATS procedure ; fortunately exam today seems benign,   today will check  ECG, routine labs including d-dimer, cxr;  also for echo and PFT's and trial dulera for ? asthma type illness  Orders: EKG w/ Interpretation (93000) T-2 View CXR, Same Day (71020.5TC) D-Dimer- [facility] [id number]) Misc. Referral (Misc. Ref) Echo Referral (Echo) TLB-BMP (Basic Metabolic Panel-BMET) (80048-METABOL) TLB-CBC Platelet - w/Differential (85025-CBCD) TLB-Hepatic/Liver Function Pnl (80076-HEPATIC)  His updated medication list for this problem includes:    Dulera 100-5 Mcg/act Aero (Mometasone furo-formoterol fum) .Marland Kitchen... 2 puffs two times a day  ADD:  ECG reviewed - sinus rhythm with NSSTTW changes, no acute  Complete Medication List: 1)  Cialis 20 Mg Tabs (Tadalafil) .... Use asd 1 by mouth every other day as needed 2)  Acyclovir 400 Mg Tabs (Acyclovir) .Marland Kitchen.. 1 by mouth two times a day 3)  Dulera 100-5 Mcg/act Aero (Mometasone furo-formoterol fum) .... 2 puffs two times a day  Patient Instructions: 1)  Please take all new medications as prescribed - the inhaler is used at 2 puffs two times a day 2)  Your EKG was ok today 3)  Please go to Radiology in the basement level for your X-Ray today  4)  Please go to the Lab in the basement for your blood and/or urine tests today 5)  You will be contacted about the referral(s) to: echocardiogram, and Lung testing (PFT's) 6)  Please call the number on the [facility] Card for results of your testing  7)  Please schedule a follow-up appointment on Mar 15 Prescriptions: DULERA 100-5 MCG/ACT AERO (MOMETASONE FURO-FORMOTEROL FUM) 2 puffs two times a day  #1 x 11   Entered and Authorized by:   Corwin Levins MD   Signed by:   Corwin Levins MD on 02/17/2011   Method used:   Print then Give to Patient   RxID:   [id number]    Orders Added: 1)  EKG w/ Interpretation [93000] 2)  T-2 View CXR, Same Day  [71020.5TC] 3)  D-Dimer- [facility] [[id number]] 4)  Misc. Referral [Misc. Ref] 5)  Echo Referral [Echo] 6)  TLB-BMP (Basic Metabolic Panel-BMET) [80048-METABOL] 7)  TLB-CBC Platelet - w/Differential [85025-CBCD] 8)  TLB-Hepatic/Liver Function Pnl [80076-HEPATIC] 9)  Est. Patient Level IV [66440] 10)  Est. Patient Level IV [34742]

## 2011-02-27 ENCOUNTER — Ambulatory Visit: Payer: BC Managed Care – PPO | Admitting: Internal Medicine

## 2011-02-27 ENCOUNTER — Ambulatory Visit (INDEPENDENT_AMBULATORY_CARE_PROVIDER_SITE_OTHER): Payer: BC Managed Care – PPO | Admitting: Internal Medicine

## 2011-02-27 ENCOUNTER — Encounter: Payer: Self-pay | Admitting: Internal Medicine

## 2011-02-27 DIAGNOSIS — E291 Testicular hypofunction: Secondary | ICD-10-CM

## 2011-02-27 DIAGNOSIS — R0609 Other forms of dyspnea: Secondary | ICD-10-CM

## 2011-02-27 DIAGNOSIS — F528 Other sexual dysfunction not due to a substance or known physiological condition: Secondary | ICD-10-CM

## 2011-03-04 NOTE — Assessment & Plan Note (Signed)
Summary: pft charges   Allergies: No Known Drug Allergies   Other Orders: Carbon Monoxide diffusing w/capacity (94729) Lung Volumes/Gas dilution or washout (94727) Spirometry (Pre & Post) (94060) 

## 2011-03-04 NOTE — Assessment & Plan Note (Signed)
Summary: fu.lb   Vital Signs:  Patient profile:   40 year old male Height:      74 inches Weight:      266.13 pounds BMI:     34.29 O2 Sat:      96 % on Room air Temp:     97.9 degrees F oral Pulse rate:   68 / minute BP sitting:   112 / 70  (left arm) Cuff size:   large  Vitals Entered By: Zella Ball Ewing CMA (AAMA) (February 27, 2011 10:08 AM)  O2 Flow:  Room air  CC: followup/RE   CC:  followup/RE.  History of Present Illness: here tp f/u; dulera no help;  still wtih mild DOE, but no HA, fever, ST, cough or other change ; and Pt denies CP, worsening sob,  wheezing, orthopnea, pnd, worsening LE edema, palps, dizziness or syncope  Pt denies new neuro symptoms such as headache, facial or extremity weakness  Pt denies polydipsia, polyuria Overall good compliance with meds, trying to follow low chol  diet, wt stable, little excercise however .  Overall good compliance with meds, and good tolerability.  No fever, wt loss, night sweats, loss of appetite or other constitutional symptoms  Overall good compliance with meds, and good tolerability.  Denies worsening depressive symptoms, suicidal ideation, or panic.  Due to return to work mar 16 - needs note to return.    Problems Prior to Update: 1)  Dyspnea On Exertion  (ICD-786.09) 2)  Erectile Dysfunction, Organic  (ICD-607.84) 3)  Sinusitis- Acute-nos  (ICD-461.9) 4)  Preventive Health Care  (ICD-V70.0) 5)  Genital Herpes  (ICD-054.10) 6)  Hyperlipidemia  (ICD-272.2) 7)  Hypogonadism, Male  (ICD-257.2) 8)  Erectile Dysfunction  (ICD-302.72) 9)  Preventive Health Care  (ICD-V70.0) 10)  Family History Diabetes 1st Degree Relative  (ICD-V18.0)  Medications Prior to Update: 1)  Cialis 20 Mg Tabs (Tadalafil) .... Use Asd 1 By Mouth Every Other Day As Needed 2)  Acyclovir 400 Mg  Tabs (Acyclovir) .Marland Kitchen.. 1 By Mouth Two Times A Day 3)  Dulera 100-5 Mcg/act Aero (Mometasone Furo-Formoterol Fum) .... 2 Puffs Two Times A Day  Current Medications  (verified): 1)  Levitra 20 Mg Tabs (Vardenafil Hcl) .Marland Kitchen.. 1 By Mouth Every Other Day As Needed 2)  Acyclovir 400 Mg  Tabs (Acyclovir) .Marland Kitchen.. 1 By Mouth Two Times A Day  Allergies (verified): No Known Drug Allergies  Past History:  Past Medical History: Last updated: 08/05/2010 hodgkin's lymphoma  s/p cmt and xrt genital herpes fatty liver/hepatic stetosis ? 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   Head:  normocephalic and atraumatic.   Eyes:  vision grossly intact, pupils equal, and pupils round.   Ears:  R ear normal and L ear normal.   Nose:  no external deformity and no nasal discharge.   Mouth:  no gingival abnormalities and pharynx pink and moist.   Neck:  supple and no masses.   Lungs:  normal respiratory effort and normal breath sounds.   Heart:  normal rate and regular rhythm.   Abdomen:  soft, non-tender, and normal  bowel sounds.   Extremities:  no edema, no erythema    Impression & Recommendations:  Problem # 1:  DYSPNEA ON EXERTION (ICD-786.09)  The following medications were removed from the medication list:    Dulera 100-5 Mcg/act Aero (Mometasone furo-formoterol fum) .Marland Kitchen... 2 puffs two times a day evaluation to date negative, and I suspect due to deconditiong and overwt;  PFT's and CXR  and labs reviewed with pt;  ok to return to work as he is due back mar 16;  will ask pt to re-schedule the echo but ok to return to work as planned  Problem # 2:  ERECTILE DYSFUNCTION, ORGANIC (ICD-607.84)  His updated medication list for this problem includes:    Levitra 20 Mg  Tabs (Vardenafil hcl) .Marland Kitchen... 1 by mouth every other day as needed cialis too expensive - to try the levitra 20 mg per (less expensive at [employer] and [employer])  Discussed proper use of medications, as well as side effects.   Problem # 3:  HYPOGONADISM, MALE (ICD-257.2) would try levitra first for ED symptoms, and consider testost replacement only if does not work well , as he is only borderline low and replacement therapy not liklye to lead to signficant improveement over baseline  Complete Medication List: 1)  Levitra 20 Mg Tabs (Vardenafil hcl) .Marland Kitchen.. 1 by mouth every other day as needed 2)  Acyclovir 400 Mg Tabs (Acyclovir) .Marland Kitchen.. 1 by mouth two times a day  Patient Instructions: 1)  stop the cialis and dulera 2)  start the levitra (remember it is less expensive at [employer] and [employer]) 3)  Continue all other previous medications as before this visit  4)  You will be contacted about the referral(s) to: echocardiogram 5)  Please call the number on the [facility] for results of your testing  6)  You are OK to return to work Mar 16 as planned 7)  Please schedule a follow-up appointment in august 2012 as planned for CPX with labs Prescriptions: LEVITRA 20 MG TABS (VARDENAFIL HCL) 1 by mouth every other day as needed  #5 x 11   Entered and Authorized by:   Corwin Levins MD   Signed by:   Corwin Levins MD on 02/27/2011   Method used:   Print then Give to Patient   RxID:   [id number]    Orders Added: 1)  Est. Patient Level IV [30865]

## 2011-03-04 NOTE — Letter (Signed)
Summary: Generic Letter  [facility]  [address] [place], [place] [address]   Phone: [phone]  Fax: [phone]    02/27/2011  Fallon Simonis [address] [place], [place]  [address]  [place]  Dear Mr. Isaza,      After your evaluation, you are found to be OK  to return to work Feb 28, 2011 as planned, without  restriction.         Sincerely,   Oliver Barre MD

## 2011-04-15 HISTORY — PX: OTHER SURGICAL HISTORY: SHX169

## 2011-04-15 HISTORY — PX: NECK SURGERY: SHX720

## 2011-05-02 NOTE — Op Note (Signed)
NAME:  Lawrence Randolph, Lawrence Randolph                  ACCOUNT NO.:  [id number]   MEDICAL RECORD NO.:  [id number]          PATIENT TYPE:  OUT   LOCATION:  PULM                         FACILITY:  [facility]   PHYSICIAN:  Ines Bloomer, M.D. DATE OF BIRTH:  December 01, 1971   DATE OF PROCEDURE:  03/13/2006  DATE OF DISCHARGE:  03/12/2006                                 OPERATIVE REPORT   PREOPERATIVE DIAGNOSES:  1.  Nodular sclerosing Hodgkin's disease.  2.  Right upper lobe mass.   POSTOPERATIVE DIAGNOSES:  1.  Nodular sclerosing Hodgkin's disease.  2.  Right upper lobe mass.   OPERATION PERFORMED:  Video bronchoscopy.   DESCRIPTION OF PROCEDURE:  After local anesthesia with Cetacaine, Lidocaine,  and IV sedation, the video bronchoscope was passed through the mouth.  The  distal trachea was normal, the carina was in the midline in the left upper  lobe, the left lower lobe orifices were normal.  The right mainstem, right  upper lobe, and right lower lobe orifices were normal.  Biopsies were taken  from the anterior segment of the right upper lobe as well as brushings and  washings.  The patient tolerated the procedure well and was returned to the  recovery room in stable condition.           ______________________________  Ines Bloomer, M.D.     DPB/MEDQ  D:  03/13/2006  T:  03/14/2006  Job:  [id number]

## 2011-05-02 NOTE — Discharge Summary (Signed)
NAME:  Lawrence Randolph, Lawrence Randolph                  ACCOUNT NO.:  [id number]   MEDICAL RECORD NO.:  [id number]          PATIENT TYPE:  INP   LOCATION:  3309                         FACILITY:  [facility]   PHYSICIAN:  Ines Bloomer, M.D. DATE OF BIRTH:  1971-05-08   DATE OF ADMISSION:  04/02/2006  DATE OF DISCHARGE:  04/06/2006                                 DISCHARGE SUMMARY   ADMISSION DIAGNOSIS:  Right upper lobe mass.   DISCHARGE DIAGNOSES:  1.  Right upper lobe mass, status post right upper lobectomy.  2.  Stage I Hodgkin's disease.  3.  History of frequent sore throats.   CONSULTATIONS:  None.   PROCEDURES:  On April 02, 2006, the patient underwent a left  VATS/thoracotomy and right upper lobectomy by Ines Bloomer, M.D.   HISTORY AND PHYSICAL:  This is a 40 year old African-American male who had a  right supraclavicular mass biopsied by Dr. Pollyann Kennedy and had clinical stage I  non-Hodgkin's disease.He has had four cycles of chemotherapy with prompt  resolution of the adenopathy.  The patient was referred to Dr. Dayton Scrape for  radiation therapy.  The patient quit smoking five years ago.  A follow-up CT  scan of the chest has identified a 3 x 1 lesion in the right upper lobe  adjacent to the superior pulmonary vein in the anterior segment of the right  upper lobe.  PET scan was also positive in this area.  It was best thought  that the patient would undergo a rightVATS, resection of the right upper  lobe mass to rule out cancer.  The risks and benefits were explained to the  patient in great detail, and he agreed to complete the above.   HOSPITAL COURSE:  Postoperatively the patient progressed well and as  expected.  His hospital stay was uneventful.  The patient's vital signs  remained stable.  On postop day #1, the patient's chest tube had moderate  drainage and no air leak.  Chest x-ray showed a tiny right apical  pneumothorax.  Postop day #2, the patient's chest x-ray was stable and his  chest tube was discontinued without any complications.  On postop day #2,  the patient remains afebrile.  He is ambulating in the halls and doing his  incentive spirometry.  The patient's chest x-ray was stable on April 05, 2006.   Physical exam within normal limits.  The patient remains afebrile with vital  signs stable.  Heart rate regular rate and rhythm.  Lungs clear to  auscultation.  Abdomen benign.  Extremities:  No edema.   Labs:  None.   DISPOSITION:  The patient will be discharged to home.   MEDICATIONS:  Tylox one to two tablets p.o. every four hours p.r.n.   INSTRUCTIONS:  The patient was instructed to follow a regular diet.  He is  to do no driving or heavy lifting greater than 10 pounds for three weeks.  The patient was instructed to ambulate two to four times daily and increase  activity slowly as tolerated.  The patient is to continue his breathing  exercises.  The patient may shower and clean his wounds with mild soap and  water.  The patient was instructed to call the office if he experiences any  problems such as wound drainage, erythema, temperature greater than 101.5.   FOLLOW-UP:  The patient is to follow up with Dr. Edwyna Shell in one week with a  chest x-ray.  The office will contact him with an appointment date.      Constance Holster, PA    ______________________________  Ines Bloomer, M.D.    JMW/MEDQ  D:  04/05/2006  T:  04/07/2006  Job:  [id number]   cc:   Enrigue Catena H. Pollyann Kennedy, MD  Fax: [phone]   Maryln Gottron, M.D.  Fax: [phone]

## 2011-05-02 NOTE — Op Note (Signed)
NAME:  Lawrence Randolph, Lawrence Randolph                  ACCOUNT NO.:  [id number]   MEDICAL RECORD NO.:  [id number]          PATIENT TYPE:  INP   LOCATION:  3309                         FACILITY:  [facility]   PHYSICIAN:  Ines Bloomer, M.D. DATE OF BIRTH:  01/26/1971   DATE OF PROCEDURE:  04/02/2006  DATE OF DISCHARGE:                                 OPERATIVE REPORT   PREOPERATIVE DIAGNOSIS:  Right upper lobe mass status post chemotherapy for  stage I Hodgkin's disease.   DESCRIPTION OF PROCEDURE:  The patient developed a right upper lobe mass  that was positive on PET scan.  After receiving some chemotherapy,  bronchoscopy just showed inflammatory atypical cells.  He is brought to the  operating room for resection of this mass, underwent general anesthesia, was  turned to the right lateral thoracotomy position, was prepped and draped in  the usual sterile manner.  A trocar site was made in the anterior axillary  line at the 7th intercostal space and a 0 degree scope was inserted in the  area where the mass was, it was stuck to the mediastinum, it was markedly  stuck, so we proceeded with a right posterolateral thoracotomy dividing the  latissimus and reflecting the serratus anteriorly.  The fifth intercostal  space was entered, a portion of the sixth rib was resected subperiosteally  at the angle.  Two Tuffiers were placed at right angles.  The right upper  lobe was stuck to the mediastinum.  We took that off the mediastinum with  sharp dissection, dissecting several areas of mediastinum with it for  permanent section.  They appeared to be inflammatory but there was marked  amount of adhesions.  Then we turned to I felt the right upper lobe and  there was just a lot of thickening.  There was no really a discrete mass but  just a lot of thickening.  I made a decision to do ahead and do a right  upper lobectomy because of the tremendous amount of inflammation and there  was a tremendous amount of  inflammation around the hilum.  I started  dissecting out the anterior hilum.  Several nodes were dissected free from  around the apical posterior branch which has a marked amount of inflammatory  areas, then we stapled that with an EZ-45 stapler.  Then the anterior apical  posterior branch of the vein was dissected out. Again there was a lot of  inflammation.  This was stapled with the AutoSuture stapler.  Next attention  was turned to the superior pulmonary vein.  This was dissected out and  stapled with the AutoSuture stapler.  Dissected down the bronchus  intermedius, dissecting down some 11R nodes, again a lot of inflammation  this area.  Next, approached the fissure, found the pulmonary artery and  dissected up the pulmonary artery through this take off of the branch of the  superior segmental artery, stapled it and divided the fissure with several  applications of the EZ-45 stapler.  Again around the pulmonary artery, we  had to do a very tedious dissection because of  the marked amount of  inflammation.  Another 11R node was removed.  We then were able to then  staple the minor fissure with two applications and finally then divided the  rest of the minor fissure with two more __________ the branch to the medial  segment.  The bronchus was then stapled with a TL 30, divided distally.  I  checked for air leak under water and no air leak was seen.  Chest tube was  brought through the trocar site and tied in place with 0silk, another trocar  site was done and tied in place with 0 silk.  Chest was closed with four  pericostals.  The patient tolerated the procedure well and was returned to  recovery room in stable condition.           ______________________________  Ines Bloomer, M.D.     DPB/MEDQ  D:  04/02/2006  T:  04/03/2006  Job:  [id number]

## 2011-05-02 NOTE — Op Note (Signed)
NAME:  Lawrence Randolph, Lawrence Randolph                  ACCOUNT NO.:  [id number]   MEDICAL RECORD NO.:  [id number]          PATIENT TYPE:  AMB   LOCATION:  DSC                          FACILITY:  [facility]   PHYSICIAN:  Jefry H. Pollyann Kennedy, MD     DATE OF BIRTH:  05-25-1971   DATE OF PROCEDURE:  09/29/2005  DATE OF DISCHARGE:                                 OPERATIVE REPORT   PREOPERATIVE DIAGNOSIS:  Left-sided neck mass, needle aspirate suspicious  for possible lymphoma.   POSTOPERATIVE DIAGNOSIS:  Left-sided neck mass, needle aspirate suspicious  for possible lymphoma.   PROCEDURE:  Open biopsy of right neck mass.   ANESTHESIA:  General endotracheal.   COMPLICATIONS:  No complications.   FINDINGS:  A large matted group of firm lymph nodes, right level 2 area.   SPECIMENS:  Specimens sent for pathologic evaluation and lymphoma workup.   HISTORY:  A 40 year old with a several-month history of a right-sided neck  mass.  Needle aspiration biopsy revealed findings of atypical lymphoid cells  suspicious for lymphoma.  Risks, benefits, alternatives, and complications  of the procedure were explained to the patient, and she seemed to understand  and agreed to the surgery.   DESCRIPTION OF PROCEDURE:  The patient was taken to the operating room and  placed on the operating table in the supine position.  Following the  induction of general endotracheal anesthesia, the right side of the neck was  prepped and draped in a standard fashion.  A transverse incision was created  approximately two fingerbreadths below the angle of the mandible.  Electrocautery was used to incise the skin and subcutaneous tissue through  the platysma layer.  The anterior edge of the sternocleidomastoid muscle was  identified and reflected posteriorly.  The underlying large nodal complex  was identified and dissected free of surrounding tissues and vessels.  None  of the major cranial nerves were identified in this limited field of  surgery.  Electrocautery was used to incise a fragment of the lymph node  mass measuring approximately 2 x 2 x 1 cm.  This was sent for pathologic  evaluation.  Electrocautery was used to complete hemostasis and the wound  was irrigated with saline and closed in two layers using 4-0 chromic on the  deep fascia and the platysma layer and running 5-0 nylon on the skin.  A 1/4-  inch Penrose was left in the wound and exited through the anterior aspect of  the incision.  A dressing was applied.  The patient was then awakened,  extubated, and transferred to recovery.      Jefry H. Pollyann Kennedy, MD  Electronically Signed    JHR/MEDQ  D:  09/29/2005  T:  09/29/2005  Job:  [id number]

## 2011-05-02 NOTE — H&P (Signed)
NAME:  Lawrence Randolph, Lawrence Randolph                  ACCOUNT NO.:  [id number]   MEDICAL RECORD NO.:  [id number]          PATIENT TYPE:  INP   LOCATION:  NA                           FACILITY:  [facility]   PHYSICIAN:  Ines Bloomer, M.D. DATE OF BIRTH:  04/13/1971   DATE OF ADMISSION:  DATE OF DISCHARGE:                                HISTORY & PHYSICAL   CHIEF COMPLAINT:  Right upper lobe mass.   HISTORY OF PRESENT ILLNESS:  This 40 year old African-American male had a  right supraclavicular mass biopsy by Dr. Pollyann Kennedy and had clinical stage I non  Hodgkin's disease. He has had four cycles of chemotherapy with prompt  resolution of the adenopathy. He was referred to Dr. Dayton Scrape for radiation  therapy. He quit smoking 5 years ago. A follow up CT scan of the chest was  done that showed a 3 x 1 lesion in the right upper lobe adjacent to the  superior pulmonary vein in the anterior segment of the right upper lobe. A  PET scan was also positive in this area.   PAST MEDICAL HISTORY:  Frequent sore throats.   MEDICATIONS:  Chemotherapy and chemotherapy nausea drugs.   ALLERGIES:  No known drug allergies.   FAMILY HISTORY:  His father had prostate cancer.   SOCIAL HISTORY:  He was married in December. Has one child. Quit smoking 5  years ago. Does not drink alcohol on a regular basis.   REVIEW OF SYSTEMS:  His weight is 255 pounds, he is 6 feet, 3 inches. No  angina or atrial fibrillation. Shortness of breath with exertion. No  hemoptysis, fever or chills. GI:  No nausea, vomiting, constipation or  diarrhea. GU: No dysuria, frequent urination. VASCULAR:  No claudication,  DVT or TIAs. NEUROLOGIC: No headaches, black out or seizures.  MUSCULOSKELETAL: No arthritis,  SKIN: No rash. PSYCHIATRIC:  No psychiatric  illnesses.  HEMATOLOGIC:  No problems with anemia.   PHYSICAL EXAMINATION:  GENERAL:  He is a well-developed African-American  male in no acute distress.  VITAL SIGNS:  Blood pressure is 132/80,  pulse 80, respirations 20,  temperature 96. Pulmonary function test showed an FECV of 3.38 with an FEV1  of 1.32.  HEENT:  Head is atraumatic. Eyes - pupils are equal, round and reactive to  light and accommodation. Extraocular movements are normal. Ears - tympanic  membranes are intact. Nose - no septal deviation. Throat without lesion.  NECK:  Right neck biopsy. No palpable supraclavicular or axillary  adenopathy. No carotid bruits.  No thyromegaly.  CHEST:  Clear to auscultation and percussion.  HEART:  Regular sinus rhythm, no murmurs.  ABDOMEN:  Soft with no hepatosplenomegaly. Bowel sounds are normal.  EXTREMITIES:  Pulses are 2+, there is no clubbing or edema.  NEUROLOGIC:  Intact.   IMPRESSION:  Right upper lobe mass. Rule out cancer.   PLAN:  Right VATS resection of right upper lobe mass.           ______________________________  Ines Bloomer, M.D.     DPB/MEDQ  D:  03/31/2006  T:  03/31/2006  Job:  [id number]

## 2011-07-28 ENCOUNTER — Other Ambulatory Visit (HOSPITAL_COMMUNITY): Payer: Self-pay

## 2011-07-29 ENCOUNTER — Other Ambulatory Visit: Payer: Self-pay | Admitting: Hematology and Oncology

## 2011-07-29 ENCOUNTER — Encounter (HOSPITAL_BASED_OUTPATIENT_CLINIC_OR_DEPARTMENT_OTHER): Payer: BC Managed Care – PPO | Admitting: Hematology and Oncology

## 2011-07-29 DIAGNOSIS — C8119 Nodular sclerosis classical Hodgkin lymphoma, extranodal and solid organ sites: Secondary | ICD-10-CM

## 2011-07-29 LAB — TSH: TSH: 1.194 u[IU]/mL (ref 0.350–4.500)

## 2011-07-29 LAB — CBC WITH DIFFERENTIAL/PLATELET
Basophils Absolute: 0 10*3/uL (ref 0.0–0.1)
EOS%: 2.1 % (ref 0.0–7.0)
HGB: 14.5 g/dL (ref 13.0–17.1)
MCH: 29.5 pg (ref 27.2–33.4)
NEUT#: 2.4 10*3/uL (ref 1.5–6.5)
RBC: 4.91 10*6/uL (ref 4.20–5.82)
RDW: 14.2 % (ref 11.0–14.6)
lymph#: 1.8 10*3/uL (ref 0.9–3.3)
nRBC: 0 % (ref 0–0)

## 2011-07-29 LAB — COMPREHENSIVE METABOLIC PANEL
ALT: 65 U/L — ABNORMAL HIGH (ref 0–53)
AST: 40 U/L — ABNORMAL HIGH (ref 0–37)
Albumin: 4.7 g/dL (ref 3.5–5.2)
Alkaline Phosphatase: 94 U/L (ref 39–117)
Glucose, Bld: 89 mg/dL (ref 70–99)
Potassium: 4.2 mEq/L (ref 3.5–5.3)
Sodium: 144 mEq/L (ref 135–145)
Total Bilirubin: 0.4 mg/dL (ref 0.3–1.2)
Total Protein: 7 g/dL (ref 6.0–8.3)

## 2011-07-30 ENCOUNTER — Encounter (HOSPITAL_BASED_OUTPATIENT_CLINIC_OR_DEPARTMENT_OTHER): Payer: BC Managed Care – PPO | Admitting: Hematology and Oncology

## 2011-07-30 ENCOUNTER — Ambulatory Visit (HOSPITAL_COMMUNITY)
Admission: RE | Admit: 2011-07-30 | Discharge: 2011-07-30 | Disposition: A | Discharge status: Home / Self Care | Payer: BC Managed Care – PPO | Source: Ambulatory Visit | Attending: Hematology and Oncology | Admitting: Hematology and Oncology

## 2011-07-30 ENCOUNTER — Encounter (HOSPITAL_COMMUNITY): Payer: Self-pay

## 2011-07-30 DIAGNOSIS — C8119 Nodular sclerosis classical Hodgkin lymphoma, extranodal and solid organ sites: Secondary | ICD-10-CM

## 2011-07-30 DIAGNOSIS — Z9221 Personal history of antineoplastic chemotherapy: Secondary | ICD-10-CM | POA: Insufficient documentation

## 2011-07-30 DIAGNOSIS — Z923 Personal history of irradiation: Secondary | ICD-10-CM | POA: Insufficient documentation

## 2011-07-30 DIAGNOSIS — K7689 Other specified diseases of liver: Secondary | ICD-10-CM | POA: Insufficient documentation

## 2011-07-30 DIAGNOSIS — C8190 Hodgkin lymphoma, unspecified, unspecified site: Secondary | ICD-10-CM | POA: Insufficient documentation

## 2011-07-30 HISTORY — DX: Malignant (primary) neoplasm, unspecified: C80.1

## 2011-07-30 MED ORDER — IOHEXOL 300 MG/ML  SOLN
125.0000 mL | Freq: Once | INTRAMUSCULAR | Status: AC | PRN
  Administered 2011-07-30: 125 mL via INTRAVENOUS

## 2012-04-04 ENCOUNTER — Other Ambulatory Visit: Payer: Self-pay | Admitting: Internal Medicine

## 2012-06-18 ENCOUNTER — Other Ambulatory Visit: Payer: Self-pay | Admitting: Internal Medicine

## 2012-06-21 ENCOUNTER — Other Ambulatory Visit: Payer: Self-pay | Admitting: Internal Medicine

## 2012-06-24 ENCOUNTER — Encounter: Payer: Self-pay | Admitting: Internal Medicine

## 2012-06-24 DIAGNOSIS — Z0000 Encounter for general adult medical examination without abnormal findings: Secondary | ICD-10-CM | POA: Insufficient documentation

## 2012-06-24 DIAGNOSIS — C8190 Hodgkin lymphoma, unspecified, unspecified site: Secondary | ICD-10-CM | POA: Insufficient documentation

## 2012-06-25 ENCOUNTER — Encounter: Payer: Self-pay | Admitting: Internal Medicine

## 2012-06-25 ENCOUNTER — Ambulatory Visit (INDEPENDENT_AMBULATORY_CARE_PROVIDER_SITE_OTHER): Payer: BC Managed Care – PPO | Admitting: Internal Medicine

## 2012-06-25 VITALS — BP 110/72 | HR 67 | Temp 97.0°F | Ht 75.0 in | Wt 269.1 lb

## 2012-06-25 DIAGNOSIS — Z0000 Encounter for general adult medical examination without abnormal findings: Secondary | ICD-10-CM

## 2012-06-25 DIAGNOSIS — A6000 Herpesviral infection of urogenital system, unspecified: Secondary | ICD-10-CM

## 2012-06-25 MED ORDER — TADALAFIL 20 MG PO TABS: 20.0000 mg | ORAL_TABLET | Freq: Every day | ORAL | Status: DC | PRN

## 2012-06-25 MED ORDER — VALACYCLOVIR HCL 1 G PO TABS: 1000.0000 mg | ORAL_TABLET | Freq: Three times a day (TID) | ORAL | Status: AC

## 2012-06-25 NOTE — Patient Instructions (Addendum)
Take all new medications as prescribed Continue all other medications as before Please continue your efforts at being more active, low cholesterol diet, and weight control. Please go to LAB in the Basement for the blood and/or urine tests to be done today You will be contacted by phone if any changes need to be made immediately.  Otherwise, you will receive a letter about your results with an explanation. Please return in 1 year for your yearly visit, or sooner if needed, with Lab testing done 3-5 days before

## 2012-06-25 NOTE — Assessment & Plan Note (Signed)

## 2012-06-26 NOTE — Assessment & Plan Note (Signed)
For valtrex asd,  to f/u any worsening symptoms or concerns 

## 2012-06-26 NOTE — Progress Notes (Signed)
Subjective:    Patient ID: Lawrence Randolph, male    DOB: 08/27/1971, 41 y.o.   MRN: [id number]  HPI  Here for wellness and f/u;  Overall doing ok;  Pt denies CP, worsening SOB, DOE, wheezing, orthopnea, PND, worsening LE edema, palpitations, dizziness or syncope.  Pt denies neurological change such as new Headache, facial or extremity weakness.  Pt denies polydipsia, polyuria, or low sugar symptoms. Pt states overall good compliance with treatment and medications, good tolerability, and trying to follow lower cholesterol diet.  Pt denies worsening depressive symptoms, suicidal ideation or panic. No fever, wt loss, night sweats, loss of appetite, or other constitutional symptoms.  Pt states good ability with ADL's, low fall risk, home safety reviewed and adequate, no significant changes in hearing or vision, and occasionally active with exercise.  Does have genital herpes outbrx about every 4-6 mo. Past Medical History  Diagnosis Date  . Cancer     hodgkins  . HYPERLIPIDEMIA 10/18/2007    Qualifier: Diagnosis of  By: Jonny Ruiz MD, Len Blalock   . GENITAL HERPES 06/08/2008    Qualifier: Diagnosis of  By: Jonny Ruiz MD, Len Blalock   . HYPOGONADISM, MALE 10/18/2007    Qualifier: Diagnosis of  By: Jonny Ruiz MD, Len Blalock   . ERECTILE DYSFUNCTION 10/11/2007    Qualifier: Diagnosis of  By: Jonny Ruiz MD, Len Blalock    Past Surgical History  Procedure Date  . Right vats     for organizing chronic inflammation RUL    reports that he has quit smoking. He has never used smokeless tobacco. He reports that he does not drink alcohol or use illicit drugs. family history includes Diabetes in an unspecified family member; Hypertension in an unspecified family member; and Prostate cancer in his father. No Known Allergies Current Outpatient Prescriptions on File Prior to Visit  Medication Sig Dispense Refill  . tadalafil (CIALIS) 20 MG tablet Take 1 tablet (20 mg total) by mouth daily as needed for erectile dysfunction.  5 tablet  11   Review  of Systems Review of Systems  Constitutional: Negative for diaphoresis, activity change, appetite change and unexpected weight change.  HENT: Negative for hearing loss, ear pain, facial swelling, mouth sores and neck stiffness.   Eyes: Negative for pain, redness and visual disturbance.  Respiratory: Negative for shortness of breath and wheezing.   Cardiovascular: Negative for chest pain and palpitations.  Gastrointestinal: Negative for diarrhea, blood in stool, abdominal distention and rectal pain.  Genitourinary: Negative for hematuria, flank pain and decreased urine volume.  Musculoskeletal: Negative for myalgias and joint swelling.  Skin: Negative for color change and wound.  Neurological: Negative for syncope and numbness.  Hematological: Negative for adenopathy.  Psychiatric/Behavioral: Negative for hallucinations, self-injury, decreased concentration and agitation.      Objective:   Physical Exam BP 110/72  Pulse 67  Temp 97 F (36.1 C) (Oral)  Ht 6\' 3"  (1.905 m)  Wt 269 lb 2 oz (122.074 kg)  BMI 33.64 kg/m2  SpO2 96% Physical Exam  VS noted Constitutional: Pt is oriented to person, place, and time. Appears well-developed and well-nourished.  HENT:  Head: Normocephalic and atraumatic.  Right Ear: External ear normal.  Left Ear: External ear normal.  Nose: Nose normal.  Mouth/Throat: Oropharynx is clear and moist.  Eyes: Conjunctivae and EOM are normal. Pupils are equal, round, and reactive to light.  Neck: Normal range of motion. Neck supple. No JVD present. No tracheal deviation present.  Cardiovascular: Normal rate, regular rhythm,  normal heart sounds and intact distal pulses.   Pulmonary/Chest: Effort normal and breath sounds normal.  Abdominal: Soft. Bowel sounds are normal. There is no tenderness.  Musculoskeletal: Normal range of motion. Exhibits no edema.  Lymphadenopathy:  Has no cervical adenopathy.  Neurological: Pt is alert and oriented to person, place, and  time. Pt has normal reflexes. No cranial nerve deficit.  Skin: Skin is warm and dry. No rash noted.  Psychiatric:  Has  normal mood and affect. Behavior is normal.     Assessment & Plan:

## 2012-07-09 ENCOUNTER — Telehealth: Payer: Self-pay | Admitting: Hematology and Oncology

## 2012-07-09 ENCOUNTER — Other Ambulatory Visit: Payer: Self-pay | Admitting: Hematology and Oncology

## 2012-07-09 DIAGNOSIS — C8590 Non-Hodgkin lymphoma, unspecified, unspecified site: Secondary | ICD-10-CM

## 2012-07-09 NOTE — Telephone Encounter (Signed)
lmonvm for pt re appt for lb/ct 8/12 and LO 8/14. Schedule mailed.

## 2012-07-23 ENCOUNTER — Encounter: Payer: Self-pay | Admitting: *Deleted

## 2012-07-26 ENCOUNTER — Other Ambulatory Visit (HOSPITAL_BASED_OUTPATIENT_CLINIC_OR_DEPARTMENT_OTHER): Payer: BC Managed Care – PPO | Admitting: Lab

## 2012-07-26 ENCOUNTER — Ambulatory Visit (HOSPITAL_COMMUNITY)
Admission: RE | Admit: 2012-07-26 | Discharge: 2012-07-26 | Disposition: A | Discharge status: Home / Self Care | Payer: BC Managed Care – PPO | Source: Ambulatory Visit | Attending: Hematology and Oncology | Admitting: Hematology and Oncology

## 2012-07-26 ENCOUNTER — Other Ambulatory Visit: Payer: Self-pay | Admitting: Hematology and Oncology

## 2012-07-26 DIAGNOSIS — K2289 Other specified disease of esophagus: Secondary | ICD-10-CM | POA: Insufficient documentation

## 2012-07-26 DIAGNOSIS — C8590 Non-Hodgkin lymphoma, unspecified, unspecified site: Secondary | ICD-10-CM

## 2012-07-26 DIAGNOSIS — Z9221 Personal history of antineoplastic chemotherapy: Secondary | ICD-10-CM | POA: Insufficient documentation

## 2012-07-26 DIAGNOSIS — C8119 Nodular sclerosis classical Hodgkin lymphoma, extranodal and solid organ sites: Secondary | ICD-10-CM

## 2012-07-26 DIAGNOSIS — K7689 Other specified diseases of liver: Secondary | ICD-10-CM | POA: Insufficient documentation

## 2012-07-26 DIAGNOSIS — K228 Other specified diseases of esophagus: Secondary | ICD-10-CM | POA: Insufficient documentation

## 2012-07-26 DIAGNOSIS — Z923 Personal history of irradiation: Secondary | ICD-10-CM | POA: Insufficient documentation

## 2012-07-26 DIAGNOSIS — C8589 Other specified types of non-Hodgkin lymphoma, extranodal and solid organ sites: Secondary | ICD-10-CM | POA: Insufficient documentation

## 2012-07-26 LAB — TSH: TSH: 1.674 u[IU]/mL (ref 0.350–4.500)

## 2012-07-26 LAB — CBC WITH DIFFERENTIAL/PLATELET
Basophils Absolute: 0 10*3/uL (ref 0.0–0.1)
Eosinophils Absolute: 0.1 10*3/uL (ref 0.0–0.5)
HCT: 43.6 % (ref 38.4–49.9)
HGB: 14.4 g/dL (ref 13.0–17.1)
MONO#: 0.5 10*3/uL (ref 0.1–0.9)
NEUT%: 62.5 % (ref 39.0–75.0)
WBC: 5.7 10*3/uL (ref 4.0–10.3)
lymph#: 1.5 10*3/uL (ref 0.9–3.3)

## 2012-07-26 LAB — CMP (CANCER CENTER ONLY)
BUN, Bld: 16 mg/dL (ref 7–22)
CO2: 29 mEq/L (ref 18–33)
Calcium: 9.8 mg/dL (ref 8.0–10.3)
Chloride: 101 mEq/L (ref 98–108)
Creat: 1.2 mg/dl (ref 0.6–1.2)

## 2012-07-26 LAB — LACTATE DEHYDROGENASE: LDH: 151 U/L (ref 94–250)

## 2012-07-26 MED ORDER — IOHEXOL 300 MG/ML  SOLN
125.0000 mL | Freq: Once | INTRAMUSCULAR | Status: AC | PRN
  Administered 2012-07-26: 125 mL via INTRAVENOUS

## 2012-07-28 ENCOUNTER — Ambulatory Visit (HOSPITAL_BASED_OUTPATIENT_CLINIC_OR_DEPARTMENT_OTHER): Payer: BC Managed Care – PPO | Admitting: Hematology and Oncology

## 2012-07-28 ENCOUNTER — Encounter: Payer: Self-pay | Admitting: Hematology and Oncology

## 2012-07-28 VITALS — BP 107/67 | HR 65 | Temp 97.2°F | Resp 20 | Ht 75.0 in | Wt 272.8 lb

## 2012-07-28 DIAGNOSIS — C8190 Hodgkin lymphoma, unspecified, unspecified site: Secondary | ICD-10-CM

## 2012-07-28 DIAGNOSIS — C8191 Hodgkin lymphoma, unspecified, lymph nodes of head, face, and neck: Secondary | ICD-10-CM

## 2012-07-28 NOTE — Patient Instructions (Addendum)
Lawrence Randolph  [id number]  [facility] Discharge Instructions  RECOMMENDATIONS MADE BY THE CONSULTANT AND ANY TEST RESULTS WILL BE SENT TO YOUR REFERRING DOCTOR.   EXAM FINDINGS BY MD TODAY AND SIGNS AND SYMPTOMS TO REPORT TO CLINIC OR PRIMARY MD:   Your current list of medications are: Current Outpatient Prescriptions  Medication Sig Dispense Refill  . tadalafil (CIALIS) 20 MG tablet Take 20 mg by mouth daily as needed.      . valACYclovir (VALTREX) 1000 MG tablet Take 1 tablet (1,000 mg total) by mouth 3 (three) times daily. As needed for rash  21 tablet  5  . DISCONTD: tadalafil (CIALIS) 20 MG tablet Take 1 tablet (20 mg total) by mouth daily as needed for erectile dysfunction.  5 tablet  11     INSTRUCTIONS GIVEN AND DISCUSSED:   SPECIAL INSTRUCTIONS/FOLLOW-UP:  See above.  I acknowledge that I have been informed and understand all the instructions given to me and received a copy. I do not have any more questions at this time, but understand that I may call the [facility] at [phone] during business hours should I have any further questions or need assistance in obtaining follow-up care.

## 2012-07-28 NOTE — Progress Notes (Signed)
This office note has been dictated.

## 2012-07-28 NOTE — Progress Notes (Signed)
CC:   Lawrence Levins, MD Maryln Gottron, M.D.  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  TREATMENT: 1. 4 cycles of doxorubicin, vinblastine, bleomycin, dacarbazine (ABVD)     from 10/29/2005 through 02/04/2006. 2. Followed by 3000 cGy in 15 sessions of radiation from 05/06/2006     through 05/27/2006.  INTENT:  Curative.  INTERVAL HISTORY:  Lawrence Randolph is here for his annual followup.  He was last seen a year ago.  He has no current concerns.  Denies fever, chills, night sweats, weight loss.  He has not noted any adenopathy. His weight remains stable.  He does not smoke.  Denies chest pain, cough or shortness of breath.  Reviewed recent staging CT of the chest, abdomen and pelvis obtained on 07/26/2012.  Postoperative changes were seen in the right upper lobe without evidence of recurrent lymphoma.  Liver, kidney, spleen are unremarkable.  There are no pathologically enlarged lymph nodes.  MEDICATIONS:  Reviewed and updated.  ALLERGIES:  None.  REVIEW OF SYSTEMS:  Ten point review of systems negative.  PHYSICAL EXAM:  The patient is a well-appearing, well-nourished man in no distress.  Vitals:  Pulse 65, blood pressure 107/67, temperature 97.2, respirations 20, weight 272.8 pounds.  HEENT:  Head is atraumatic, normocephalic.  Sclerae anicteric.  Mouth is moist.  Neck:  Supple. Chest:  Clear to percussion and auscultation.  CVS:  First and second heart sounds present with no added sounds or murmurs.  Abdomen:  Soft, obese, nontender.  No masses.  No hepatomegaly.  Bowel sounds present. Extremities:  No calf tenderness.  Lymph nodes:  No palpable cervical, axillary, inguinal adenopathy.  CNS:  Nonfocal.  LAB DATA:  White cell count 5.7, hemoglobin 14.4, hematocrit 43.6, platelets 211.  Sodium 140, potassium 4, chloride 101, CO2 29, BUN 16, creatinine 1.2.  Glucose 95.  T bilirubin 0.7, alkaline  phosphatase is 85, AST 38, ALT 59, LDH 151.  TSH 1.674.  Results of CTs as above.  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  IMPRESSION AND PLAN:  The patient is a 41 year old man with history of early stage Hodgkin lymphoma for which he received abbreviated chemotherapy with ABVD followed by radiation therapy.  He is 7 years out and his current exam and blood work indicate no evidence of recurrence. The patient will be referred and back to his primary care physician, Dr. Oliver Barre, for continuing surveillance.  We would recommend the following monitoring for late effects after 5 years of therapy such as an annual H and P with management of cardiovascular risk factors, up-to- date with vaccines, annual influenza vaccine.  Also recommend a baseline stress test echocardiogram at 10 years.  Also consider carotid ultrasound at 10 years as the patient had received neck radiation.  With his annual physical, would suggestive thyroid function tests as he received radiation therapy to his neck.  Chest imaging is optional as the patient is a nonsmoker and did not receive radiation therapy to his lungs.  I also advised weight loss with frequent exercise.  If any questions or concerns please do not hesitate to contact us.    ______________________________ Laurice Record, M.D. LIO/MEDQ  D:  07/28/2012  T:  07/28/2012  Job:  [id number]

## 2012-08-27 ENCOUNTER — Telehealth: Payer: Self-pay | Admitting: Internal Medicine

## 2012-08-27 NOTE — Telephone Encounter (Signed)
The patient called the triage line and is hoping to make some changes to his medication.  He is hoping for a call back to discuss.  Thanks!

## 2012-08-30 NOTE — Telephone Encounter (Signed)
Called the patient left message to call back 

## 2012-08-31 NOTE — Telephone Encounter (Signed)
Called the patient left message to call back 

## 2012-09-02 MED ORDER — VARDENAFIL HCL 20 MG PO TABS: 20.0000 mg | ORAL_TABLET | ORAL | Status: DC | PRN

## 2012-09-02 NOTE — Telephone Encounter (Signed)
Done erx 

## 2012-09-02 NOTE — Telephone Encounter (Signed)
Called the patient and he would like to go back to Levitra and sent to [facility] [address] Please.  Is ok to call leave message to inform sent in.

## 2012-09-02 NOTE — Telephone Encounter (Signed)
Patient informed. 

## 2013-10-12 ENCOUNTER — Encounter: Payer: Self-pay | Admitting: Internal Medicine

## 2013-10-12 ENCOUNTER — Other Ambulatory Visit (INDEPENDENT_AMBULATORY_CARE_PROVIDER_SITE_OTHER): Payer: BC Managed Care – PPO

## 2013-10-12 ENCOUNTER — Ambulatory Visit (INDEPENDENT_AMBULATORY_CARE_PROVIDER_SITE_OTHER): Payer: BC Managed Care – PPO | Admitting: Internal Medicine

## 2013-10-12 VITALS — BP 112/72 | HR 78 | Temp 97.5°F | Ht 75.0 in | Wt 266.5 lb

## 2013-10-12 DIAGNOSIS — Z0000 Encounter for general adult medical examination without abnormal findings: Secondary | ICD-10-CM

## 2013-10-12 DIAGNOSIS — N419 Inflammatory disease of prostate, unspecified: Secondary | ICD-10-CM | POA: Insufficient documentation

## 2013-10-12 LAB — CBC WITH DIFFERENTIAL/PLATELET
Basophils Relative: 0.3 % (ref 0.0–3.0)
Eosinophils Relative: 1.9 % (ref 0.0–5.0)
HCT: 43.8 % (ref 39.0–52.0)
Hemoglobin: 14.9 g/dL (ref 13.0–17.0)
Lymphs Abs: 2.1 10*3/uL (ref 0.7–4.0)
Monocytes Relative: 8.7 % (ref 3.0–12.0)
Neutro Abs: 3.9 10*3/uL (ref 1.4–7.7)
Platelets: 206 10*3/uL (ref 150.0–400.0)
RBC: 5.07 Mil/uL (ref 4.22–5.81)
WBC: 6.7 10*3/uL (ref 4.5–10.5)

## 2013-10-12 LAB — TSH: TSH: 1.86 u[IU]/mL (ref 0.35–5.50)

## 2013-10-12 LAB — BASIC METABOLIC PANEL
BUN: 21 mg/dL (ref 6–23)
Calcium: 9.8 mg/dL (ref 8.4–10.5)
GFR: 103.07 mL/min (ref >60.00)
Glucose, Bld: 84 mg/dL (ref 70–99)

## 2013-10-12 LAB — URINALYSIS, ROUTINE W REFLEX MICROSCOPIC
Ketones, ur: NEGATIVE
Leukocytes, UA: NEGATIVE
Specific Gravity, Urine: >=1.03 (ref 1.000–1.030)
pH: 6 (ref 5.0–8.0)

## 2013-10-12 LAB — HEPATIC FUNCTION PANEL: Albumin: 4.5 g/dL (ref 3.5–5.2)

## 2013-10-12 LAB — LIPID PANEL
HDL: 41.3 mg/dL (ref >39.00)
Triglycerides: 214 mg/dL — ABNORMAL HIGH (ref 0.0–149.0)
VLDL: 42.8 mg/dL — ABNORMAL HIGH (ref 0.0–40.0)

## 2013-10-12 LAB — LDL CHOLESTEROL, DIRECT: Direct LDL: 151.6 mg/dL

## 2013-10-12 MED ORDER — VARDENAFIL HCL 20 MG PO TABS: 20.0000 mg | ORAL_TABLET | ORAL | Status: DC | PRN

## 2013-10-12 MED ORDER — DOXYCYCLINE HYCLATE 100 MG PO TABS: 100.0000 mg | ORAL_TABLET | Freq: Two times a day (BID) | ORAL | Status: DC

## 2013-10-12 NOTE — Assessment & Plan Note (Signed)

## 2013-10-12 NOTE — Progress Notes (Signed)
Subjective:    Patient ID: Lawrence Randolph, male    DOB: August 24, 1971, 42 y.o.   MRN: [id number]  HPI  Here for wellness and f/u;  Overall doing ok;  Pt denies CP, worsening SOB, DOE, wheezing, orthopnea, PND, worsening LE edema, palpitations, dizziness or syncope.  Pt denies neurological change such as new headache, facial or extremity weakness.  Pt denies polydipsia, polyuria, or low sugar symptoms. Pt states overall good compliance with treatment and medications, good tolerability, and has been trying to follow lower cholesterol diet.  Pt denies worsening depressive symptoms, suicidal ideation or panic. No fever, night sweats, wt loss, loss of appetite, or other constitutional symptoms.  Pt states good ability with ADL's, has low fall risk, home safety reviewed and adequate, no other significant changes in hearing or vision, and only occasionally active with exercise.  Does recurring dull low mid pain, occ urgency and freq worse with bending over at work, assoc with mild swelling left testicle for several wks, but Denies urinary symptoms such as flank pain, hematuria or n/v, fever, chills. Past Medical History  Diagnosis Date  . HYPERLIPIDEMIA 10/18/2007    Qualifier: Diagnosis of  By: Jonny Ruiz MD, Len Blalock   . GENITAL HERPES 06/08/2008    Qualifier: Diagnosis of  By: Jonny Ruiz MD, Len Blalock   . HYPOGONADISM, MALE 10/18/2007    Qualifier: Diagnosis of  By: Jonny Ruiz MD, Len Blalock   . ERECTILE DYSFUNCTION 10/11/2007    Qualifier: Diagnosis of  By: Jonny Ruiz MD, Len Blalock   . Cancer 09/28/2005    hodgkins lymphoma   Past Surgical History  Procedure Laterality Date  . Right vats      for organizing chronic inflammation RUL    reports that he has quit smoking. His smoking use included Cigarettes. He has a 3.75 pack-year smoking history. He has never used smokeless tobacco. He reports that he does not drink alcohol or use illicit drugs. family history includes Diabetes in an other family member; Hypertension in an other family  member; Prostate cancer in his father and paternal grandfather. No Known Allergies Current Outpatient Prescriptions on File Prior to Visit  Medication Sig Dispense Refill  . vardenafil (LEVITRA) 20 MG tablet Take 1 tablet (20 mg total) by mouth as needed for erectile dysfunction.  10 tablet  11  . tadalafil (CIALIS) 20 MG tablet Take 20 mg by mouth daily as needed.       No current facility-administered medications on file prior to visit.   Had the flu shot at work.  Has strong FH prostate cancer  - father and uncles.   Review of Systems Constitutional: Negative for diaphoresis, activity change, appetite change or unexpected weight change.  HENT: Negative for hearing loss, ear pain, facial swelling, mouth sores and neck stiffness.   Eyes: Negative for pain, redness and visual disturbance.  Respiratory: Negative for shortness of breath and wheezing.   Cardiovascular: Negative for chest pain and palpitations.  Gastrointestinal: Negative for diarrhea, blood in stool, abdominal distention or other pain Genitourinary: Negative for hematuria, flank pain or change in urine volume.  Musculoskeletal: Negative for myalgias and joint swelling.  Skin: Negative for color change and wound.  Neurological: Negative for syncope and numbness. other than noted Hematological: Negative for adenopathy.  Psychiatric/Behavioral: Negative for hallucinations, self-injury, decreased concentration and agitation.      Objective:   Physical Exam BP 112/72  Pulse 78  Temp(Src) 97.5 F (36.4 C) (Oral)  Ht 6\' 3"  (1.905 m)  Wt  266 lb 8 oz (120.884 kg)  BMI 33.31 kg/m2  SpO2 96% VS noted,  Constitutional: Pt is oriented to person, place, and time. Appears well-developed and well-nourished.  Head: Normocephalic and atraumatic.  Right Ear: External ear normal.  Left Ear: External ear normal.  Nose: Nose normal.  Mouth/Throat: Oropharynx is clear and moist.  Eyes: Conjunctivae and EOM are normal. Pupils are  equal, round, and reactive to light.  Neck: Normal range of motion. Neck supple. No JVD present. No tracheal deviation present.  Cardiovascular: Normal rate, regular rhythm, normal heart sounds and intact distal pulses.   Pulmonary/Chest: Effort normal and breath sounds normal.  Abdominal: Soft. Bowel sounds are normal. There is no tenderness. No HSM  Musculoskeletal: Normal range of motion. Exhibits no edema.  Lymphadenopathy:  Has no cervical adenopathy.  Neurological: Pt is alert and oriented to person, place, and time. Pt has normal reflexes. No cranial nerve deficit.  Skin: Skin is warm and dry. No rash noted.  Psychiatric:  Has  normal mood and affect. Behavior is normal.  GU: left testicle minimal swelling, tender o/w exam benign    Assessment & Plan:

## 2013-10-12 NOTE — Patient Instructions (Addendum)
Please take all new medication as prescribed Please call in 3-4 wks if not improved, for referral to Urology Please continue all other medications as before, and refills have been done if requested. Please have the pharmacy call with any other refills you may need. Please continue your efforts at being more active, low cholesterol diet, and weight control. You are otherwise up to date with prevention measures today. Please keep your appointments with your specialists as you may have planned Please go to the LAB in the Basement (turn left off the elevator) for the tests to be done today You will be contacted by phone if any changes need to be made immediately.  Otherwise, you will receive a letter about your results with an explanation, but please check with MyChart first.  Please remember to sign up for My Chart if you have not done so, as this will be important to you in the future with finding out test results, communicating by private email, and scheduling acute appointments online when needed.  Please return in 1 year for your yearly visit, or sooner if needed, with Lab testing done 3-5 days before

## 2013-10-12 NOTE — Assessment & Plan Note (Signed)
Most likely, and assoc with prob mild left orchitis, as well,Mild to mod, for antibx course,  to f/u any worsening symptoms or concerns, for urology if not improved

## 2013-10-14 ENCOUNTER — Telehealth: Payer: Self-pay | Admitting: Internal Medicine

## 2013-10-14 NOTE — Telephone Encounter (Signed)
10/14/2013  Pt left message wanting to test results from 10/12/2013 visit.

## 2013-10-14 NOTE — Telephone Encounter (Signed)
Called the patient back informed of results.

## 2014-03-20 ENCOUNTER — Encounter (HOSPITAL_COMMUNITY): Payer: Self-pay | Admitting: Emergency Medicine

## 2014-03-20 ENCOUNTER — Emergency Department (HOSPITAL_COMMUNITY)
Admission: EM | Admit: 2014-03-20 | Discharge: 2014-03-20 | Disposition: A | Discharge status: Home / Self Care | Payer: BC Managed Care – PPO | Attending: Emergency Medicine | Admitting: Emergency Medicine

## 2014-03-20 ENCOUNTER — Emergency Department (HOSPITAL_COMMUNITY): Payer: BC Managed Care – PPO

## 2014-03-20 DIAGNOSIS — Z87891 Personal history of nicotine dependence: Secondary | ICD-10-CM | POA: Insufficient documentation

## 2014-03-20 DIAGNOSIS — Z8619 Personal history of other infectious and parasitic diseases: Secondary | ICD-10-CM | POA: Insufficient documentation

## 2014-03-20 DIAGNOSIS — Z862 Personal history of diseases of the blood and blood-forming organs and certain disorders involving the immune mechanism: Secondary | ICD-10-CM | POA: Insufficient documentation

## 2014-03-20 DIAGNOSIS — Z8639 Personal history of other endocrine, nutritional and metabolic disease: Secondary | ICD-10-CM | POA: Insufficient documentation

## 2014-03-20 DIAGNOSIS — Z8571 Personal history of Hodgkin lymphoma: Secondary | ICD-10-CM | POA: Insufficient documentation

## 2014-03-20 DIAGNOSIS — K219 Gastro-esophageal reflux disease without esophagitis: Secondary | ICD-10-CM | POA: Insufficient documentation

## 2014-03-20 LAB — BASIC METABOLIC PANEL
BUN: 19 mg/dL (ref 6–23)
CO2: 22 meq/L (ref 19–32)
Calcium: 9.2 mg/dL (ref 8.4–10.5)
Chloride: 104 mEq/L (ref 96–112)
Creatinine, Ser: 1.15 mg/dL (ref 0.50–1.35)
GFR calc Af Amer: 88 mL/min — ABNORMAL LOW (ref >90)
GFR calc non Af Amer: 76 mL/min — ABNORMAL LOW (ref >90)
GLUCOSE: 104 mg/dL — AB (ref 70–99)
Potassium: 4 mEq/L (ref 3.7–5.3)
SODIUM: 140 meq/L (ref 137–147)

## 2014-03-20 LAB — CBC
HCT: 44.2 % (ref 39.0–52.0)
HEMOGLOBIN: 14.9 g/dL (ref 13.0–17.0)
MCH: 29.6 pg (ref 26.0–34.0)
MCHC: 33.7 g/dL (ref 30.0–36.0)
MCV: 87.9 fL (ref 78.0–100.0)
PLATELETS: 205 10*3/uL (ref 150–400)
RBC: 5.03 MIL/uL (ref 4.22–5.81)
RDW: 14.3 % (ref 11.5–15.5)
WBC: 6.8 10*3/uL (ref 4.0–10.5)

## 2014-03-20 LAB — I-STAT TROPONIN, ED: TROPONIN I, POC: 0.01 ng/mL (ref 0.00–0.08)

## 2014-03-20 MED ORDER — PANTOPRAZOLE SODIUM 20 MG PO TBEC: 20.0000 mg | DELAYED_RELEASE_TABLET | Freq: Every day | ORAL | Status: DC

## 2014-03-20 MED ORDER — GI COCKTAIL ~~LOC~~
30.0000 mL | Freq: Once | ORAL | Status: AC
  Administered 2014-03-20: 30 mL via ORAL
  Filled 2014-03-20: qty 30

## 2014-03-20 NOTE — ED Notes (Signed)
Pt. reports intermittent mid chest pain for 3 days " burning / sharp " , denies SOB , no nausea or diaphoresis .

## 2014-03-20 NOTE — ED Notes (Signed)
Yelverton MD at bedside. 

## 2014-03-20 NOTE — ED Provider Notes (Signed)
CSN: [id number]     Arrival date & time 03/20/14  0351 History   First MD Initiated Contact with Patient 03/20/14 0542     Chief Complaint  Patient presents with  . Chest Pain     (Consider location/radiation/quality/duration/timing/severity/associated sxs/prior Treatment) Patient is a 43 y.o. male presenting with chest pain.  Chest Pain Associated symptoms: no abdominal pain, no back pain, no cough, no dizziness, no fever, no nausea, no numbness, no palpitations, no shortness of breath, not vomiting and no weakness    Patient presents with retrosternal burning type pain that is worse at nights when he is lying down. He has had similar types of pain after eating. He admits to taking TUMS which helps with his symptoms. He has no nausea or vomiting. He denies any shortness of breath. He admits to eating a lot of spicy foods but denies NSAID intake. He has no recent extended travel or surgeries. He's had no lower extremity swelling or pain. Patient has no personal or family history of coronary artery disease. His been treated in the past for Hodgkin's lymphoma but is cancer free at this point Past Medical History  Diagnosis Date  . HYPERLIPIDEMIA 10/18/2007    Qualifier: Diagnosis of  By: Jenny Reichmann MD, [place] 06/08/2008    Qualifier: Diagnosis of  By: Jenny Reichmann MD, [place]   . HYPOGONADISM, MALE 10/18/2007    Qualifier: Diagnosis of  By: Jenny Reichmann MD, [place] ERECTILE DYSFUNCTION 10/11/2007    Qualifier: Diagnosis of  By: Jenny Reichmann MD, [place] 09/28/2005    hodgkins lymphoma   Past Surgical History  Procedure Laterality Date  . Right vats      for organizing chronic inflammation RUL   Family History  Problem Relation Age of Onset  . Prostate cancer Father   . Hypertension    . Diabetes    . Prostate cancer Paternal Grandfather    History  Substance Use Topics  . Smoking status: Former Smoker -- 0.25 packs/day for 15 years    Types: Cigarettes  . Smokeless tobacco:  Never Used  . Alcohol Use: No    Review of Systems  Constitutional: Negative for fever and chills.  Respiratory: Negative for cough and shortness of breath.   Cardiovascular: Positive for chest pain. Negative for palpitations and leg swelling.  Gastrointestinal: Negative for nausea, vomiting, abdominal pain, diarrhea, constipation and blood in stool.  Musculoskeletal: Negative for back pain, myalgias, neck pain and neck stiffness.  Skin: Negative for rash and wound.  Neurological: Negative for dizziness, weakness and numbness.  All other systems reviewed and are negative.      Allergies  Review of patient's allergies indicates no known allergies.  Home Medications   Current Outpatient Rx  Name  Route  Sig  Dispense  Refill  . vardenafil (LEVITRA) 20 MG tablet   Oral   Take 1 tablet (20 mg total) by mouth as needed for erectile dysfunction.   10 tablet   11   . EXPIRED: tadalafil (CIALIS) 20 MG tablet   Oral   Take 20 mg by mouth daily as needed.          BP 124/81  Pulse 62  Temp(Src) 97.8 F (36.6 C) (Oral)  Resp 18  Ht 6\' 3"  (1.905 m)  Wt 270 lb (122.471 kg)  BMI 33.75 kg/m2  SpO2 96% Physical Exam  Nursing note and vitals reviewed. Constitutional: He is oriented to  person, place, and time. He appears well-developed and well-nourished. No distress.  HENT:  Head: Normocephalic and atraumatic.  Mouth/Throat: Oropharynx is clear and moist.  Eyes: EOM are normal. Pupils are equal, round, and reactive to light.  Neck: Normal range of motion. Neck supple.  Cardiovascular: Normal rate and regular rhythm.   Pulmonary/Chest: Effort normal and breath sounds normal. No respiratory distress. He has no wheezes. He has no rales. He exhibits no tenderness.  Abdominal: Soft. Bowel sounds are normal. He exhibits no distension and no mass. There is no tenderness. There is no rebound and no guarding.  Musculoskeletal: Normal range of motion. He exhibits no edema and no  tenderness.  No calf swelling or tenderness.  Neurological: He is alert and oriented to person, place, and time.  Skin: Skin is warm and dry. No rash noted. No erythema.  Psychiatric: He has a normal mood and affect. His behavior is normal.    ED Course  Procedures (including critical care time) Labs Review Labs Reviewed  BASIC METABOLIC PANEL - Abnormal; Notable for the following:    Glucose, Bld 104 (*)    GFR calc non Af Amer 76 (*)    GFR calc Af Amer 88 (*)    All other components within normal limits  CBC  I-STAT TROPOININ, ED   Imaging Review Dg Chest 2 View  03/20/2014   CLINICAL DATA:  Mid chest pain.  EXAM: CHEST  2 VIEW  COMPARISON:  Chest radiograph performed 02/17/2011, and CT of the chest performed 07/26/2012  FINDINGS: The lungs are well-aerated and clear. There is no evidence of focal opacification, pleural effusion or pneumothorax. Postoperative change is noted at the right hilum.  The heart is normal in size; the mediastinal contour is within normal limits. No acute osseous abnormalities are seen. There is a chronic right posterior sixth rib fracture.  IMPRESSION: No acute cardiopulmonary process seen.   Electronically Signed   By: Garald Balding M.D.   On: 03/20/2014 04:24     EKG Interpretation   Date/Time:  Monday March 20 2014 03:58:33 EDT Ventricular Rate:  61 PR Interval:  180 QRS Duration: 90 QT Interval:  376 QTC Calculation: 378 R Axis:   51 Text Interpretation:  Normal sinus rhythm ST \\T \ T wave abnormality,  consider inferior ischemia Abnormal ECG No significant change since last  tracing Confirmed by OPITZ  MD, BRIAN ([id number]) on 03/20/2014 4:14:21 AM      MDM   Final diagnoses:  None    EKG without acute abnormalities. Troponin is negative. I have low suspicion for coronary artery disease in this patient. Symptoms are consistent with gastroesophageal reflux disease. I have advocated lifestyle changes and will start on PPI. Should patient's  symptoms persist he will need to followup with her gastroenterologist. Return precautions have been given and the patient has voiced understanding.    Julianne Rice, MD 03/20/14 [phone]

## 2014-03-20 NOTE — ED Notes (Signed)
Pt states understanding of discharge instructions 

## 2014-03-20 NOTE — Discharge Instructions (Signed)
Diet for Gastroesophageal Reflux Disease, Adult Reflux (acid reflux) is when acid from your stomach flows up into the esophagus. When acid comes in contact with the esophagus, the acid causes irritation and soreness (inflammation) in the esophagus. When reflux happens often or so severely that it causes damage to the esophagus, it is called gastroesophageal reflux disease (GERD). Nutrition therapy can help ease the discomfort of GERD. FOODS OR DRINKS TO AVOID OR LIMIT  Smoking or chewing tobacco. Nicotine is one of the most potent stimulants to acid production in the gastrointestinal tract.  Caffeinated and decaffeinated coffee and black tea.  Regular or low-calorie carbonated beverages or energy drinks (caffeine-free carbonated beverages are allowed).   Strong spices, such as black pepper, white pepper, red pepper, cayenne, curry powder, and chili powder.  Peppermint or spearmint.  Chocolate.  Ulin-fat foods, including meats and fried foods. Extra added fats including oils, butter, salad dressings, and nuts. Limit these to less than 8 tsp per day.  Fruits and vegetables if they are not tolerated, such as citrus fruits or tomatoes.  Alcohol.  Any food that seems to aggravate your condition. If you have questions regarding your diet, call your caregiver or a registered dietitian. OTHER THINGS THAT MAY HELP GERD INCLUDE:   Eating your meals slowly, in a relaxed setting.  Eating 5 to 6 small meals per day instead of 3 large meals.  Eliminating food for a period of time if it causes distress.  Not lying down until 3 hours after eating a meal.  Keeping the head of your bed raised 6 to 9 inches (15 to 23 cm) by using a foam wedge or blocks under the legs of the bed. Lying flat may make symptoms worse.  Being physically active. Weight loss may be helpful in reducing reflux in overweight or obese adults.  Wear loose fitting clothing EXAMPLE MEAL PLAN This meal plan is approximately  2,000 calories based on ChooseMy[employer].gov meal planning guidelines. Breakfast   cup cooked oatmeal.  1 cup strawberries.  1 cup low-fat milk.  1 oz almonds. Snack  1 cup cucumber slices.  6 oz yogurt (made from low-fat or fat-free milk). Lunch  2 slice whole-wheat bread.  2 oz sliced turkey.  2 tsp mayonnaise.  1 cup blueberries.  1 cup snap peas. Snack  6 whole-wheat crackers.  1 oz string cheese. Dinner   cup brown rice.  1 cup mixed veggies.  1 tsp olive oil.  3 oz grilled fish. Document Released: 12/01/2005 Document Revised: 02/23/2012 Document Reviewed: 10/17/2011 ExitCare Patient Information 2014 [employer]. Gastroesophageal Reflux Disease, Adult Gastroesophageal reflux disease (GERD) happens when acid from your stomach flows up into the esophagus. When acid comes in contact with the esophagus, the acid causes soreness (inflammation) in the esophagus. Over time, GERD may create small holes (ulcers) in the lining of the esophagus. CAUSES   Increased body weight. This puts pressure on the stomach, making acid rise from the stomach into the esophagus.  Smoking. This increases acid production in the stomach.  Drinking alcohol. This causes decreased pressure in the lower esophageal sphincter (valve or ring of muscle between the esophagus and stomach), allowing acid from the stomach into the esophagus.  Late evening meals and a full stomach. This increases pressure and acid production in the stomach.  A malformed lower esophageal sphincter. Sometimes, no cause is found. SYMPTOMS   Burning pain in the lower part of the mid-chest behind the breastbone and in the mid-stomach area. This may   occur twice a week or more often.  Trouble swallowing.  Sore throat.  Dry cough.  Asthma-like symptoms including chest tightness, shortness of breath, or wheezing. DIAGNOSIS  Your caregiver may be able to diagnose GERD based on your symptoms. In some cases,  X-rays and other tests may be done to check for complications or to check the condition of your stomach and esophagus. TREATMENT  Your caregiver may recommend over-the-counter or prescription medicines to help decrease acid production. Ask your caregiver before starting or adding any new medicines.  HOME CARE INSTRUCTIONS   Change the factors that you can control. Ask your caregiver for guidance concerning weight loss, quitting smoking, and alcohol consumption.  Avoid foods and drinks that make your symptoms worse, such as:  Caffeine or alcoholic drinks.  Chocolate.  Peppermint or mint flavorings.  Garlic and onions.  Spicy foods.  Citrus fruits, such as oranges, lemons, or limes.  Tomato-based foods such as sauce, chili, salsa, and pizza.  Fried and fatty foods.  Avoid lying down for the 3 hours prior to your bedtime or prior to taking a nap.  Eat small, frequent meals instead of large meals.  Wear loose-fitting clothing. Do not wear anything tight around your waist that causes pressure on your stomach.  Raise the head of your bed 6 to 8 inches with wood blocks to help you sleep. Extra pillows will not help.  Only take over-the-counter or prescription medicines for pain, discomfort, or fever as directed by your caregiver.  Do not take aspirin, ibuprofen, or other nonsteroidal anti-inflammatory drugs (NSAIDs). SEEK IMMEDIATE MEDICAL CARE IF:   You have pain in your arms, neck, jaw, teeth, or back.  Your pain increases or changes in intensity or duration.  You develop nausea, vomiting, or sweating (diaphoresis).  You develop shortness of breath, or you faint.  Your vomit is green, yellow, black, or looks like coffee grounds or blood.  Your stool is red, bloody, or black. These symptoms could be signs of other problems, such as heart disease, gastric bleeding, or esophageal bleeding. MAKE SURE YOU:   Understand these instructions.  Will watch your  condition.  Will get help right away if you are not doing well or get worse. Document Released: 09/10/2005 Document Revised: 02/23/2012 Document Reviewed: 06/20/2011 ExitCare Patient Information 2014 [employer].  

## 2014-11-23 ENCOUNTER — Ambulatory Visit: Payer: BC Managed Care – PPO | Admitting: Internal Medicine

## 2014-11-24 ENCOUNTER — Other Ambulatory Visit (INDEPENDENT_AMBULATORY_CARE_PROVIDER_SITE_OTHER): Payer: BC Managed Care – PPO

## 2014-11-24 ENCOUNTER — Ambulatory Visit (INDEPENDENT_AMBULATORY_CARE_PROVIDER_SITE_OTHER): Payer: BC Managed Care – PPO | Admitting: Internal Medicine

## 2014-11-24 ENCOUNTER — Encounter: Payer: Self-pay | Admitting: Internal Medicine

## 2014-11-24 VITALS — BP 120/72 | HR 80 | Temp 98.3°F | Ht 75.0 in | Wt 270.1 lb

## 2014-11-24 DIAGNOSIS — Z0000 Encounter for general adult medical examination without abnormal findings: Secondary | ICD-10-CM

## 2014-11-24 DIAGNOSIS — F528 Other sexual dysfunction not due to a substance or known physiological condition: Secondary | ICD-10-CM

## 2014-11-24 LAB — CBC WITH DIFFERENTIAL/PLATELET
BASOS PCT: 0.9 % (ref 0.0–3.0)
Basophils Absolute: 0.1 10*3/uL (ref 0.0–0.1)
EOS ABS: 0.1 10*3/uL (ref 0.0–0.7)
EOS PCT: 1.9 % (ref 0.0–5.0)
HCT: 44.6 % (ref 39.0–52.0)
Hemoglobin: 14.8 g/dL (ref 13.0–17.0)
Lymphocytes Relative: 39.7 % (ref 12.0–46.0)
Lymphs Abs: 2.7 10*3/uL (ref 0.7–4.0)
MCHC: 33.2 g/dL (ref 30.0–36.0)
MCV: 86.4 fl (ref 78.0–100.0)
Monocytes Absolute: 0.4 10*3/uL (ref 0.1–1.0)
Monocytes Relative: 5.4 % (ref 3.0–12.0)
NEUTROS PCT: 52.1 % (ref 43.0–77.0)
Neutro Abs: 3.5 10*3/uL (ref 1.4–7.7)
Platelets: 234 10*3/uL (ref 150.0–400.0)
RBC: 5.17 Mil/uL (ref 4.22–5.81)
RDW: 14.4 % (ref 11.5–15.5)
WBC: 6.7 10*3/uL (ref 4.0–10.5)

## 2014-11-24 LAB — URINALYSIS, ROUTINE W REFLEX MICROSCOPIC
Bilirubin Urine: NEGATIVE
HGB URINE DIPSTICK: NEGATIVE
Ketones, ur: NEGATIVE
Leukocytes, UA: NEGATIVE
Nitrite: NEGATIVE
Specific Gravity, Urine: >=1.03 — AB (ref 1.000–1.030)
TOTAL PROTEIN, URINE-UPE24: NEGATIVE
Urine Glucose: NEGATIVE
Urobilinogen, UA: 0.2 (ref 0.0–1.0)
pH: 5.5 (ref 5.0–8.0)

## 2014-11-24 MED ORDER — VARDENAFIL HCL 20 MG PO TABS: 20.0000 mg | ORAL_TABLET | ORAL | Status: DC | PRN

## 2014-11-24 MED ORDER — SILDENAFIL CITRATE 20 MG PO TABS: ORAL_TABLET | ORAL | Status: DC

## 2014-11-24 NOTE — Progress Notes (Signed)
Pre visit review using our clinic review tool, if applicable. No additional management support is needed unless otherwise documented below in the visit note. 

## 2014-11-24 NOTE — Patient Instructions (Addendum)
Please take all new medication as prescribed - the generic viagra, which is only able to be filled at [facility]  Please continue all other medications as before, and refills have been done if requested.  Please have the pharmacy call with any other refills you may need.  Please continue your efforts at being more active, low cholesterol diet, and weight control.  You are otherwise up to date with prevention measures today.  Please keep your appointments with your specialists as you may have planned  Please go to the LAB in the Basement (turn left off the elevator) for the tests to be done today  You will be contacted by phone if any changes need to be made immediately.  Otherwise, you will receive a letter about your results with an explanation, but please check with MyChart first.  Please remember to sign up for MyChart if you have not done so, as this will be important to you in the future with finding out test results, communicating by private email, and scheduling acute appointments online when needed.  Please return in 6 months, or sooner if needed

## 2014-11-24 NOTE — Progress Notes (Signed)
Subjective:    Patient ID: Lawrence Randolph, male    DOB: [birth year], 43 y.o.   MRN: [id number]  HPI Here for wellness and f/u;  Overall doing ok;  Pt denies CP, worsening SOB, DOE, wheezing, orthopnea, PND, worsening LE edema, palpitations, dizziness or syncope.  Pt denies neurological change such as new headache, facial or extremity weakness.  Pt denies polydipsia, polyuria, or low sugar symptoms. Pt states overall good compliance with treatment and medications, good tolerability, and has been trying to follow lower cholesterol diet.  Pt denies worsening depressive symptoms, suicidal ideation or panic. No fever, night sweats, wt loss, loss of appetite, or other constitutional symptoms.  Pt states good ability with ADL's, has low fall risk, home safety reviewed and adequate, no other significant changes in hearing or vision, and only occasionally active with exercise.  Has ongoing ED, asks for levitra refill. Past Medical History  Diagnosis Date  . HYPERLIPIDEMIA 10/18/2007    Qualifier: Diagnosis of  By: Jenny Reichmann MD, [place] 06/08/2008    Qualifier: Diagnosis of  By: Jenny Reichmann MD, [place]   . HYPOGONADISM, MALE 10/18/2007    Qualifier: Diagnosis of  By: Jenny Reichmann MD, [place] ERECTILE DYSFUNCTION 10/11/2007    Qualifier: Diagnosis of  By: Jenny Reichmann MD, [place] 09/28/2005    hodgkins lymphoma   Past Surgical History  Procedure Laterality Date  . Right vats      for organizing chronic inflammation RUL    reports that he has quit smoking. His smoking use included Cigarettes. He has a 3.75 pack-year smoking history. He has never used smokeless tobacco. He reports that he does not drink alcohol or use illicit drugs. family history includes Diabetes in an other family member; Hypertension in an other family member; Prostate cancer in his father and paternal grandfather. No Known Allergies Current Outpatient Prescriptions on File Prior to Visit  Medication Sig Dispense Refill  .  pantoprazole (PROTONIX) 20 MG tablet Take 1 tablet (20 mg total) by mouth daily. 30 tablet 0  . tadalafil (CIALIS) 20 MG tablet Take 20 mg by mouth daily as needed.     No current facility-administered medications on file prior to visit.   Review of Systems Constitutional: Negative for increased diaphoresis, other activity, appetite or other siginficant weight change  HENT: Negative for worsening hearing loss, ear pain, facial swelling, mouth sores and neck stiffness.   Eyes: Negative for other worsening pain, redness or visual disturbance.  Respiratory: Negative for shortness of breath and wheezing.   Cardiovascular: Negative for chest pain and palpitations.  Gastrointestinal: Negative for diarrhea, blood in stool, abdominal distention or other pain Genitourinary: Negative for hematuria, flank pain or change in urine volume.  Musculoskeletal: Negative for myalgias or other joint complaints.  Skin: Negative for color change and wound.  Neurological: Negative for syncope and numbness. other than noted Hematological: Negative for adenopathy. or other swelling Psychiatric/Behavioral: Negative for hallucinations, self-injury, decreased concentration or other worsening agitation.      Objective:   Physical Exam BP 120/72 mmHg  Pulse 80  Temp(Src) 98.3 F (36.8 C) (Oral)  Ht 6\' 3"  (1.905 m)  Wt 270 lb 2 oz (122.528 kg)  BMI 33.76 kg/m2  SpO2 95% VS noted,  Constitutional: Pt is oriented to person, place, and time. Appears well-developed and well-nourished.  Head: Normocephalic and atraumatic.  Right Ear: External ear normal.  Left Ear: External ear normal.  Nose:  Nose normal.  Mouth/Throat: Oropharynx is clear and moist.  Eyes: Conjunctivae and EOM are normal. Pupils are equal, round, and reactive to light.  Neck: Normal range of motion. Neck supple. No JVD present. No tracheal deviation present.  Cardiovascular: Normal rate, regular rhythm, normal heart sounds and intact distal  pulses.   Pulmonary/Chest: Effort normal and breath sounds without rales or wheezing  Abdominal: Soft. Bowel sounds are normal. NT. No HSM  Musculoskeletal: Normal range of motion. Exhibits no edema.  Lymphadenopathy:  Has no cervical adenopathy.  Neurological: Pt is alert and oriented to person, place, and time. Pt has normal reflexes. No cranial nerve deficit. Motor grossly intact Skin: Skin is warm and dry. No rash noted.  Psychiatric:  Has normal mood and affect. Behavior is normal.     Assessment & Plan:

## 2014-11-26 NOTE — Assessment & Plan Note (Signed)

## 2014-11-26 NOTE — Assessment & Plan Note (Signed)
For levitra refill

## 2014-11-27 LAB — BASIC METABOLIC PANEL
BUN: 18 mg/dL (ref 6–23)
CHLORIDE: 106 meq/L (ref 96–112)
CO2: 25 meq/L (ref 19–32)
CREATININE: 1.1 mg/dL (ref 0.4–1.5)
Calcium: 10.4 mg/dL (ref 8.4–10.5)
GFR: 93.91 mL/min (ref >60.00)
Glucose, Bld: 90 mg/dL (ref 70–99)
Potassium: 5 mEq/L (ref 3.5–5.1)
Sodium: 137 mEq/L (ref 135–145)

## 2014-11-27 LAB — LIPID PANEL
Cholesterol: 253 mg/dL — ABNORMAL HIGH (ref 0–200)
HDL: 40.6 mg/dL (ref >39.00)
LDL Cholesterol: 179 mg/dL — ABNORMAL HIGH (ref 0–99)
NonHDL: 212.4
TRIGLYCERIDES: 165 mg/dL — AB (ref 0.0–149.0)
Total CHOL/HDL Ratio: 6
VLDL: 33 mg/dL (ref 0.0–40.0)

## 2014-11-27 LAB — HEPATIC FUNCTION PANEL
ALT: 68 U/L — AB (ref 0–53)
AST: 45 U/L — ABNORMAL HIGH (ref 0–37)
Albumin: 4.7 g/dL (ref 3.5–5.2)
Alkaline Phosphatase: 93 U/L (ref 39–117)
Bilirubin, Direct: 0.1 mg/dL (ref 0.0–0.3)
TOTAL PROTEIN: 7.6 g/dL (ref 6.0–8.3)
Total Bilirubin: 0.7 mg/dL (ref 0.2–1.2)

## 2014-11-27 LAB — TSH: TSH: 1.6 u[IU]/mL (ref 0.35–4.50)

## 2014-11-27 LAB — PSA: PSA: 0.78 ng/mL (ref 0.10–4.00)

## 2014-11-28 ENCOUNTER — Telehealth: Payer: Self-pay | Admitting: Internal Medicine

## 2014-11-28 NOTE — Telephone Encounter (Signed)
Pt called inquiring about lab results.  Please call pt back.

## 2014-11-29 NOTE — Telephone Encounter (Signed)
Labs normal except elev cholesterol  Message left on mychart the day labs were done  Please follow lower chol diet  We can send letter of results if needed

## 2014-11-29 NOTE — Telephone Encounter (Signed)
Called the patient left a detailed message and did send a copy in the mail to the patient.

## 2015-01-22 ENCOUNTER — Telehealth: Payer: Self-pay | Admitting: Internal Medicine

## 2015-01-22 NOTE — Telephone Encounter (Signed)
Patient is requesting refill on levitra.

## 2015-01-23 MED ORDER — VARDENAFIL HCL 20 MG PO TABS: 20.0000 mg | ORAL_TABLET | ORAL | Status: DC | PRN

## 2015-01-23 NOTE — Telephone Encounter (Signed)
Done erx 

## 2015-01-25 ENCOUNTER — Telehealth: Payer: Self-pay | Admitting: Internal Medicine

## 2015-01-25 NOTE — Telephone Encounter (Signed)
Pt came in to check up on the PA for vardenafil (LEVITRA), pt stated he might call and check on it tomorrow. We explain how the PA work to him and he is aware it might take a little while to get response back from [employer].

## 2015-01-25 NOTE — Telephone Encounter (Signed)
PA was submitted. Waiting for response.

## 2015-01-26 MED ORDER — VARDENAFIL HCL 20 MG PO TABS: 20.0000 mg | ORAL_TABLET | ORAL | Status: DC | PRN

## 2015-01-26 NOTE — Telephone Encounter (Signed)
Levitra approved 01/23/15 through 01/24/16 for a qty of 8 tablets per month.  [facility] notified and pt notified

## 2015-01-26 NOTE — Addendum Note (Signed)
Addended by: Townsend Roger D on: 01/26/2015 04:00 PM   Modules accepted: Orders

## 2015-10-17 ENCOUNTER — Encounter: Payer: Self-pay | Admitting: Internal Medicine

## 2015-10-17 ENCOUNTER — Ambulatory Visit (INDEPENDENT_AMBULATORY_CARE_PROVIDER_SITE_OTHER): Payer: BLUE CROSS/BLUE SHIELD | Admitting: Internal Medicine

## 2015-10-17 ENCOUNTER — Other Ambulatory Visit: Payer: Self-pay | Admitting: Internal Medicine

## 2015-10-17 ENCOUNTER — Other Ambulatory Visit (INDEPENDENT_AMBULATORY_CARE_PROVIDER_SITE_OTHER): Payer: BLUE CROSS/BLUE SHIELD

## 2015-10-17 VITALS — BP 120/80 | HR 66 | Temp 99.0°F | Ht 75.0 in | Wt 273.0 lb

## 2015-10-17 DIAGNOSIS — Z0000 Encounter for general adult medical examination without abnormal findings: Secondary | ICD-10-CM

## 2015-10-17 DIAGNOSIS — G5601 Carpal tunnel syndrome, right upper limb: Secondary | ICD-10-CM

## 2015-10-17 DIAGNOSIS — F528 Other sexual dysfunction not due to a substance or known physiological condition: Secondary | ICD-10-CM

## 2015-10-17 DIAGNOSIS — E782 Mixed hyperlipidemia: Secondary | ICD-10-CM

## 2015-10-17 DIAGNOSIS — R7989 Other specified abnormal findings of blood chemistry: Secondary | ICD-10-CM

## 2015-10-17 DIAGNOSIS — G5600 Carpal tunnel syndrome, unspecified upper limb: Secondary | ICD-10-CM | POA: Insufficient documentation

## 2015-10-17 LAB — URINALYSIS, ROUTINE W REFLEX MICROSCOPIC
Bilirubin Urine: NEGATIVE
HGB URINE DIPSTICK: NEGATIVE
Ketones, ur: NEGATIVE
LEUKOCYTES UA: NEGATIVE
Nitrite: NEGATIVE
PH: 6 (ref 5.0–8.0)
RBC / HPF: NONE SEEN (ref 0–?)
Specific Gravity, Urine: 1.025 (ref 1.000–1.030)
Total Protein, Urine: NEGATIVE
Urine Glucose: NEGATIVE
Urobilinogen, UA: 0.2 (ref 0.0–1.0)
WBC, UA: NONE SEEN (ref 0–?)

## 2015-10-17 LAB — CBC WITH DIFFERENTIAL/PLATELET
Basophils Absolute: 0 10*3/uL (ref 0.0–0.1)
Basophils Relative: 0.4 % (ref 0.0–3.0)
EOS ABS: 0.1 10*3/uL (ref 0.0–0.7)
Eosinophils Relative: 2.1 % (ref 0.0–5.0)
HCT: 43.9 % (ref 39.0–52.0)
Hemoglobin: 14.6 g/dL (ref 13.0–17.0)
LYMPHS ABS: 2.2 10*3/uL (ref 0.7–4.0)
Lymphocytes Relative: 31.7 % (ref 12.0–46.0)
MCHC: 33.1 g/dL (ref 30.0–36.0)
MCV: 87.7 fl (ref 78.0–100.0)
MONO ABS: 0.5 10*3/uL (ref 0.1–1.0)
Monocytes Relative: 8 % (ref 3.0–12.0)
NEUTROS PCT: 57.8 % (ref 43.0–77.0)
Neutro Abs: 3.9 10*3/uL (ref 1.4–7.7)
PLATELETS: 198 10*3/uL (ref 150.0–400.0)
RBC: 5.01 Mil/uL (ref 4.22–5.81)
RDW: 14.2 % (ref 11.5–15.5)
WBC: 6.8 10*3/uL (ref 4.0–10.5)

## 2015-10-17 LAB — LIPID PANEL
CHOLESTEROL: 208 mg/dL — AB (ref 0–200)
HDL: 42.5 mg/dL (ref >39.00)
NonHDL: 165.33
Total CHOL/HDL Ratio: 5
Triglycerides: 218 mg/dL — ABNORMAL HIGH (ref 0.0–149.0)
VLDL: 43.6 mg/dL — ABNORMAL HIGH (ref 0.0–40.0)

## 2015-10-17 LAB — PSA: PSA: 0.59 ng/mL (ref 0.10–4.00)

## 2015-10-17 LAB — HEPATIC FUNCTION PANEL
ALK PHOS: 95 U/L (ref 39–117)
ALT: 45 U/L (ref 0–53)
AST: 27 U/L (ref 0–37)
Albumin: 4.2 g/dL (ref 3.5–5.2)
Bilirubin, Direct: 0.1 mg/dL (ref 0.0–0.3)
Total Bilirubin: 0.4 mg/dL (ref 0.2–1.2)
Total Protein: 6.9 g/dL (ref 6.0–8.3)

## 2015-10-17 LAB — BASIC METABOLIC PANEL
BUN: 12 mg/dL (ref 6–23)
CO2: 32 mEq/L (ref 19–32)
Calcium: 9.7 mg/dL (ref 8.4–10.5)
Chloride: 105 mEq/L (ref 96–112)
Creatinine, Ser: 0.92 mg/dL (ref 0.40–1.50)
GFR: 113.76 mL/min (ref >60.00)
GLUCOSE: 96 mg/dL (ref 70–99)
Potassium: 4.2 mEq/L (ref 3.5–5.1)
SODIUM: 141 meq/L (ref 135–145)

## 2015-10-17 LAB — TSH: TSH: 1.25 u[IU]/mL (ref 0.35–4.50)

## 2015-10-17 LAB — LDL CHOLESTEROL, DIRECT: LDL DIRECT: 148 mg/dL

## 2015-10-17 MED ORDER — ROSUVASTATIN CALCIUM 20 MG PO TABS: 20.0000 mg | ORAL_TABLET | Freq: Every day | ORAL | Status: DC

## 2015-10-17 MED ORDER — ASPIRIN EC 81 MG PO TBEC: 81.0000 mg | DELAYED_RELEASE_TABLET | Freq: Every day | ORAL | Status: DC

## 2015-10-17 MED ORDER — SILDENAFIL CITRATE 20 MG PO TABS: ORAL_TABLET | ORAL | Status: DC

## 2015-10-17 NOTE — Assessment & Plan Note (Signed)
Lab Results  Component Value Date   LDLCALC 179* 11/24/2014   Goal < 100., for lower chol diet, recheck lab, for statin consider

## 2015-10-17 NOTE — Progress Notes (Signed)
Subjective:    Patient ID: Lawrence Randolph, male    DOB: 14-Apr-1971, 44 y.o.   MRN: [id number]  HPI  Here for wellness and f/u;  Overall doing ok;  Pt denies Chest pain, worsening SOB, DOE, wheezing, orthopnea, PND, worsening LE edema, palpitations, dizziness or syncope.  Pt denies neurological change such as new headache, facial or extremity weakness, but has had an intermittent fleeting tingling and numbness to RUE from shoulder to tip of hand, has occas neck pain but none severe recenlty..  Pt denies polydipsia, polyuria, or low sugar symptoms. Pt states overall good compliance with treatment and medications, good tolerability, and has been trying to follow appropriate diet.  Pt denies worsening depressive symptoms, suicidal ideation or panic. No fever, night sweats, wt loss, loss of appetite, or other constitutional symptoms.  Pt states good ability with ADL's, has low fall risk, home safety reviewed and adequate, no other significant changes in hearing or vision, and only occasionally active with exercise. Asks for generic viagra. Mentions father uncle, and grandfather with prostate ca  Past Medical History  Diagnosis Date  . HYPERLIPIDEMIA 10/18/2007    Qualifier: Diagnosis of  By: Jenny Reichmann MD, [place] 06/08/2008    Qualifier: Diagnosis of  By: Jenny Reichmann MD, [place]   . HYPOGONADISM, MALE 10/18/2007    Qualifier: Diagnosis of  By: Jenny Reichmann MD, [place] ERECTILE DYSFUNCTION 10/11/2007    Qualifier: Diagnosis of  By: Jenny Reichmann MD, [facility]) 09/28/2005    hodgkins lymphoma   Past Surgical History  Procedure Laterality Date  . Right vats      for organizing chronic inflammation RUL    reports that he has quit smoking. His smoking use included Cigarettes. He has a 3.75 pack-year smoking history. He has never used smokeless tobacco. He reports that he does not drink alcohol or use illicit drugs. family history includes Diabetes in an other family member; Hypertension in an  other family member; Prostate cancer in his father and paternal grandfather. No Known Allergies Current Outpatient Prescriptions on File Prior to Visit  Medication Sig Dispense Refill  . vardenafil (LEVITRA) 20 MG tablet Take 1 tablet (20 mg total) by mouth as needed for erectile dysfunction. 8 tablet 11  . pantoprazole (PROTONIX) 20 MG tablet Take 1 tablet (20 mg total) by mouth daily. (Patient not taking: Reported on 10/17/2015) 30 tablet 0  . sildenafil (REVATIO) 20 MG tablet 3-5 tabs by mouth every other day as needed (Patient not taking: Reported on 10/17/2015) 60 tablet 5  . tadalafil (CIALIS) 20 MG tablet Take 20 mg by mouth daily as needed.     No current facility-administered medications on file prior to visit.   Review of Systems Constitutional: Negative for increased diaphoresis, other activity, appetite or siginficant weight change other than noted HENT: Negative for worsening hearing loss, ear pain, facial swelling, mouth sores and neck stiffness.   Eyes: Negative for other worsening pain, redness or visual disturbance.  Respiratory: Negative for shortness of breath and wheezing  Cardiovascular: Negative for chest pain and palpitations.  Gastrointestinal: Negative for diarrhea, blood in stool, abdominal distention or other pain Genitourinary: Negative for hematuria, flank pain or change in urine volume.  Musculoskeletal: Negative for myalgias or other joint complaints.  Skin: Negative for color change and wound or drainage.  Neurological: Negative for syncope and numbness. other than noted Hematological: Negative for adenopathy. or other swelling Psychiatric/Behavioral: Negative  for hallucinations, SI, self-injury, decreased concentration or other worsening agitation.      Objective:   Physical Exam BP 120/80 mmHg  Pulse 66  Temp(Src) 99 F (37.2 C) (Oral)  Ht 6\' 3"  (1.905 m)  Wt 273 lb (123.832 kg)  BMI 34.12 kg/m2  SpO2 97% VS noted,  Constitutional: Pt is oriented  to person, place, and time. Appears well-developed and well-nourished, in no significant distress Head: Normocephalic and atraumatic.  Right Ear: External ear normal.  Left Ear: External ear normal.  Nose: Nose normal.  Mouth/Throat: Oropharynx is clear and moist.  Eyes: Conjunctivae and EOM are normal. Pupils are equal, round, and reactive to light.  Neck: Normal range of motion. Neck supple. No JVD present. No tracheal deviation present or significant neck LA or mass Cardiovascular: Normal rate, regular rhythm, normal heart sounds and intact distal pulses.   Pulmonary/Chest: Effort normal and breath sounds without rales or wheezing  Abdominal: Soft. Bowel sounds are normal. NT. No HSM  Musculoskeletal: Normal range of motion. Exhibits no edema.  Lymphadenopathy:  Has no cervical adenopathy.  Neurological: Pt is alert and oriented to person, place, and time. Pt has normal reflexes. No cranial nerve deficit. Motor grossly intact Skin: Skin is warm and dry. No rash noted.  Psychiatric:  Has normal mood and affect. Behavior is normal.        Assessment & Plan:

## 2015-10-17 NOTE — Addendum Note (Signed)
Addended by: Biagio Borg on: 10/17/2015 03:33 PM   Modules accepted: Orders, Medications, SmartSet

## 2015-10-17 NOTE — Assessment & Plan Note (Signed)
Ok for revatio prn

## 2015-10-17 NOTE — Assessment & Plan Note (Signed)
Mild tingling only, for right wrist splint qhs until improved

## 2015-10-17 NOTE — Patient Instructions (Addendum)
OK to try the generic 20 mg type of viagra  Please try wearing a right wrist splint OTC until the numbness is improved  Please call if the arm becomes painful or weak, for referral to [facility]  Please continue all other medications as before, and refills have been done if requested.  Please have the pharmacy call with any other refills you may need.  Please continue your efforts at being more active, low cholesterol diet, and weight control.  You are otherwise up to date with prevention measures today.  Please keep your appointments with your specialists as you may have planned  Please go to the LAB in the Basement (turn left off the elevator) for the tests to be done today  You will be contacted by phone if any changes need to be made immediately.  Otherwise, you will receive a letter about your results with an explanation, but please check with MyChart first.  Please remember to sign up for MyChart if you have not done so, as this will be important to you in the future with finding out test results, communicating by private email, and scheduling acute appointments online when needed.  Please return in 1 year for your yearly visit, or sooner if needed, with Lab testing done 3-5 days before

## 2015-10-17 NOTE — Assessment & Plan Note (Signed)

## 2015-10-23 ENCOUNTER — Telehealth: Payer: Self-pay | Admitting: Internal Medicine

## 2015-10-23 NOTE — Telephone Encounter (Signed)
Faxed per pt request.

## 2015-10-23 NOTE — Telephone Encounter (Signed)
Pt came by office and stated his insurance company needs the script for Sildenafil (revatio) 20mg  tab sent to fax # [phone].  Phone # for issues regarding script is [phone].  Per pt local pharmacy cannot fill this script.

## 2015-10-30 NOTE — Telephone Encounter (Signed)
Pt called insurance co and they did not receive the fax.  The fax number they gave him today is [phone]. Can please refax prescription and then call pt at [phone]

## 2015-10-31 MED ORDER — SILDENAFIL CITRATE 20 MG PO TABS: ORAL_TABLET | ORAL | Status: DC

## 2015-10-31 NOTE — Telephone Encounter (Signed)
Rx faxed again. Pt advised of same via personal VM

## 2015-11-07 ENCOUNTER — Other Ambulatory Visit: Payer: Self-pay | Admitting: Internal Medicine

## 2015-11-07 MED ORDER — SILDENAFIL CITRATE 20 MG PO TABS: ORAL_TABLET | ORAL | Status: DC

## 2016-01-28 ENCOUNTER — Other Ambulatory Visit: Payer: Self-pay

## 2016-01-28 MED ORDER — VARDENAFIL HCL 20 MG PO TABS: 20.0000 mg | ORAL_TABLET | ORAL | Status: DC | PRN

## 2016-01-29 ENCOUNTER — Telehealth: Payer: Self-pay | Admitting: Internal Medicine

## 2016-01-29 NOTE — Telephone Encounter (Signed)
Pt called stated that vardenafil (LEVITRA) 20 MG tablet need to start PA. Please help

## 2016-01-30 NOTE — Telephone Encounter (Signed)
PA initiated via faxed form

## 2016-02-06 NOTE — Telephone Encounter (Signed)
PA Approved 01/31/2016 - 01/30/2017. Pharmacy advised

## 2017-02-13 ENCOUNTER — Telehealth: Payer: Self-pay | Admitting: Emergency Medicine

## 2017-02-13 MED ORDER — VARDENAFIL HCL 20 MG PO TABS: 20.0000 mg | ORAL_TABLET | ORAL | 11 refills | Status: DC | PRN

## 2017-02-13 NOTE — Telephone Encounter (Signed)
Done erx 

## 2017-02-13 NOTE — Telephone Encounter (Signed)
Pt came in thinking his appt was today he is out of his medication vardenafil (LEVITRA) 20 MG tablet. Can you please refill this until his CPE appt on 03/06/17. Please advise thanks.

## 2017-02-13 NOTE — Telephone Encounter (Signed)
See below

## 2017-03-06 ENCOUNTER — Encounter: Payer: Self-pay | Admitting: Internal Medicine

## 2017-03-06 ENCOUNTER — Other Ambulatory Visit: Payer: Self-pay | Admitting: Internal Medicine

## 2017-03-06 ENCOUNTER — Ambulatory Visit (INDEPENDENT_AMBULATORY_CARE_PROVIDER_SITE_OTHER): Payer: BLUE CROSS/BLUE SHIELD | Admitting: Internal Medicine

## 2017-03-06 ENCOUNTER — Other Ambulatory Visit (INDEPENDENT_AMBULATORY_CARE_PROVIDER_SITE_OTHER): Payer: BLUE CROSS/BLUE SHIELD

## 2017-03-06 VITALS — BP 114/70 | HR 84 | Temp 98.1°F | Ht 75.0 in | Wt 278.0 lb

## 2017-03-06 DIAGNOSIS — Z0000 Encounter for general adult medical examination without abnormal findings: Secondary | ICD-10-CM

## 2017-03-06 DIAGNOSIS — E782 Mixed hyperlipidemia: Secondary | ICD-10-CM

## 2017-03-06 LAB — CBC WITH DIFFERENTIAL/PLATELET
Basophils Absolute: 0 10*3/uL (ref 0.0–0.1)
Basophils Relative: 0.6 % (ref 0.0–3.0)
EOS ABS: 0.1 10*3/uL (ref 0.0–0.7)
Eosinophils Relative: 1.6 % (ref 0.0–5.0)
HCT: 44.8 % (ref 39.0–52.0)
HEMOGLOBIN: 14.7 g/dL (ref 13.0–17.0)
Lymphocytes Relative: 39.7 % (ref 12.0–46.0)
Lymphs Abs: 2.3 10*3/uL (ref 0.7–4.0)
MCHC: 32.8 g/dL (ref 30.0–36.0)
MCV: 87.7 fl (ref 78.0–100.0)
MONO ABS: 0.5 10*3/uL (ref 0.1–1.0)
Monocytes Relative: 9.3 % (ref 3.0–12.0)
NEUTROS PCT: 48.8 % (ref 43.0–77.0)
Neutro Abs: 2.8 10*3/uL (ref 1.4–7.7)
Platelets: 226 10*3/uL (ref 150.0–400.0)
RBC: 5.11 Mil/uL (ref 4.22–5.81)
RDW: 14.4 % (ref 11.5–15.5)
WBC: 5.7 10*3/uL (ref 4.0–10.5)

## 2017-03-06 LAB — BASIC METABOLIC PANEL
BUN: 18 mg/dL (ref 6–23)
CHLORIDE: 107 meq/L (ref 96–112)
CO2: 26 mEq/L (ref 19–32)
Calcium: 10 mg/dL (ref 8.4–10.5)
Creatinine, Ser: 1.08 mg/dL (ref 0.40–1.50)
GFR: 93.97 mL/min (ref >60.00)
GLUCOSE: 91 mg/dL (ref 70–99)
POTASSIUM: 4.5 meq/L (ref 3.5–5.1)
Sodium: 141 mEq/L (ref 135–145)

## 2017-03-06 LAB — URINALYSIS, ROUTINE W REFLEX MICROSCOPIC
Bilirubin Urine: NEGATIVE
HGB URINE DIPSTICK: NEGATIVE
Ketones, ur: NEGATIVE
LEUKOCYTES UA: NEGATIVE
NITRITE: NEGATIVE
PH: 5.5 (ref 5.0–8.0)
Specific Gravity, Urine: 1.025 (ref 1.000–1.030)
TOTAL PROTEIN, URINE-UPE24: NEGATIVE
Urine Glucose: NEGATIVE
Urobilinogen, UA: 0.2 (ref 0.0–1.0)
WBC, UA: NONE SEEN (ref 0–?)

## 2017-03-06 LAB — HEPATIC FUNCTION PANEL
ALT: 50 U/L (ref 0–53)
AST: 32 U/L (ref 0–37)
Albumin: 4.8 g/dL (ref 3.5–5.2)
Alkaline Phosphatase: 93 U/L (ref 39–117)
Bilirubin, Direct: 0.1 mg/dL (ref 0.0–0.3)
Total Bilirubin: 0.4 mg/dL (ref 0.2–1.2)
Total Protein: 7.1 g/dL (ref 6.0–8.3)

## 2017-03-06 LAB — LIPID PANEL
CHOLESTEROL: 224 mg/dL — AB (ref 0–200)
HDL: 41.2 mg/dL (ref >39.00)
LDL Cholesterol: 154 mg/dL — ABNORMAL HIGH (ref 0–99)
NonHDL: 182.57
TRIGLYCERIDES: 145 mg/dL (ref 0.0–149.0)
Total CHOL/HDL Ratio: 5
VLDL: 29 mg/dL (ref 0.0–40.0)

## 2017-03-06 LAB — TSH: TSH: 1.48 u[IU]/mL (ref 0.35–4.50)

## 2017-03-06 LAB — PSA: PSA: 0.72 ng/mL (ref 0.10–4.00)

## 2017-03-06 MED ORDER — PANTOPRAZOLE SODIUM 20 MG PO TBEC: 20.0000 mg | DELAYED_RELEASE_TABLET | Freq: Every day | ORAL | 3 refills | Status: DC

## 2017-03-06 MED ORDER — ROSUVASTATIN CALCIUM 20 MG PO TABS: 20.0000 mg | ORAL_TABLET | Freq: Every day | ORAL | 3 refills | Status: DC

## 2017-03-06 MED ORDER — VARDENAFIL HCL 20 MG PO TABS: 20.0000 mg | ORAL_TABLET | ORAL | 11 refills | Status: DC | PRN

## 2017-03-06 NOTE — Progress Notes (Signed)
Subjective:    Patient ID: Lawrence Randolph, male    DOB: [birth year], 46 y.o.   MRN: [id number]  HPI  Here for wellness and f/u;  Overall doing ok;  Pt denies Chest pain, worsening SOB, DOE, wheezing, orthopnea, PND, worsening LE edema, palpitations, dizziness or syncope.  Pt denies neurological change such as new headache, facial or extremity weakness.  Pt denies polydipsia, polyuria, or low sugar symptoms. Pt states overall good compliance with treatment and medications, good tolerability, and has been trying to follow appropriate diet.  Pt denies worsening depressive symptoms, suicidal ideation or panic. No fever, night sweats, wt loss, loss of appetite, or other constitutional symptoms.  Pt states good ability with ADL's, has low fall risk, home safety reviewed and adequate, no other significant changes in hearing or vision, and only occasionally active with exercise. Has been out of crestor  20 but wants to check chol and possible restart  Father , grandfather and uncle all with prostate ca.  No other new hx Past Medical History:  Diagnosis Date  . Cancer ([place]) 09/28/2005   hodgkins lymphoma  . ERECTILE DYSFUNCTION 10/11/2007   Qualifier: Diagnosis of  By: Jenny Reichmann MD, [place] 06/08/2008   Qualifier: Diagnosis of  By: Jenny Reichmann MD, [place]   . HYPERLIPIDEMIA 10/18/2007   Qualifier: Diagnosis of  By: Jenny Reichmann MD, [place]   . HYPOGONADISM, MALE 10/18/2007   Qualifier: Diagnosis of  By: Jenny Reichmann MD, [place]    Past Surgical History:  Procedure Laterality Date  . right VATS     for organizing chronic inflammation RUL    reports that he has quit smoking. His smoking use included Cigarettes. He has a 3.75 pack-year smoking history. He has never used smokeless tobacco. He reports that he does not drink alcohol or use drugs. family history includes Prostate cancer in his father and paternal grandfather. No Known Allergies Current Outpatient Prescriptions on File Prior to Visit  Medication Sig  Dispense Refill  . aspirin EC 81 MG tablet Take 1 tablet (81 mg total) by mouth daily. 90 tablet 11   No current facility-administered medications on file prior to visit.    Review of Systems Constitutional: Negative for increased diaphoresis, or other activity, appetite or siginficant weight change other than noted HENT: Negative for worsening hearing loss, ear pain, facial swelling, mouth sores and neck stiffness.   Eyes: Negative for other worsening pain, redness or visual disturbance.  Respiratory: Negative for choking or stridor Cardiovascular: Negative for other chest pain and palpitations.  Gastrointestinal: Negative for worsening diarrhea, blood in stool, or abdominal distention Genitourinary: Negative for hematuria, flank pain or change in urine volume.  Musculoskeletal: Negative for myalgias or other joint complaints.  Skin: Negative for other color change and wound or drainage.  Neurological: Negative for syncope and numbness. other than noted Hematological: Negative for adenopathy. or other swelling Psychiatric/Behavioral: Negative for hallucinations, SI, self-injury, decreased concentration or other worsening agitation.  All other system neg per pt    Objective:   Physical Exam BP 114/70   Pulse 84   Temp 98.1 F (36.7 C) (Oral)   Ht 6\' 3"  (1.905 m)   Wt 278 lb (126.1 kg)   SpO2 98%   BMI 34.75 kg/m  VS noted,  Constitutional: Pt is oriented to person, place, and time. Appears well-developed and well-nourished, in no significant distress Head: Normocephalic and atraumatic  Eyes: Conjunctivae and EOM are normal. Pupils are equal, round,  and reactive to light Right Ear: External ear normal.  Left Ear: External ear normal Nose: Nose normal.  Mouth/Throat: Oropharynx is clear and moist  Neck: Normal range of motion. Neck supple. No JVD present. No tracheal deviation present or significant neck LA or mass Cardiovascular: Normal rate, regular rhythm, normal heart sounds  and intact distal pulses.   Pulmonary/Chest: Effort normal and breath sounds without rales or wheezing  Abdominal: Soft. Bowel sounds are normal. NT. No HSM  Musculoskeletal: Normal range of motion. Exhibits no edema Lymphadenopathy: Has no cervical adenopathy.  Neurological: Pt is alert and oriented to person, place, and time. Pt has normal reflexes. No cranial nerve deficit. Motor grossly intact Skin: Skin is warm and dry. No rash noted or new ulcers Psychiatric:  Has normal mood and affect. Behavior is normal. \ No other exam findings  ECG today I have personally interpreted Sinus  Rhythm - WNL     Assessment & Plan:

## 2017-03-06 NOTE — Assessment & Plan Note (Signed)
To restart crestor 20, lower chol diet

## 2017-03-06 NOTE — Progress Notes (Signed)
Pre visit review using our clinic review tool, if applicable. No additional management support is needed unless otherwise documented below in the visit note. 

## 2017-03-06 NOTE — Patient Instructions (Signed)

## 2017-03-06 NOTE — Assessment & Plan Note (Signed)

## 2017-03-08 LAB — HIV ANTIBODY (ROUTINE TESTING W REFLEX): HIV 1&2 Ab, 4th Generation: NONREACTIVE

## 2017-07-02 ENCOUNTER — Encounter (HOSPITAL_COMMUNITY): Payer: Self-pay | Admitting: Emergency Medicine

## 2017-07-02 ENCOUNTER — Ambulatory Visit (HOSPITAL_COMMUNITY)
Admission: EM | Admit: 2017-07-02 | Discharge: 2017-07-02 | Disposition: A | Discharge status: Home / Self Care | Payer: BLUE CROSS/BLUE SHIELD | Attending: Family Medicine | Admitting: Family Medicine

## 2017-07-02 DIAGNOSIS — R197 Diarrhea, unspecified: Secondary | ICD-10-CM | POA: Diagnosis not present

## 2017-07-02 MED ORDER — ONDANSETRON 4 MG PO TBDP: 4.0000 mg | ORAL_TABLET | Freq: Three times a day (TID) | ORAL | 0 refills | Status: DC | PRN

## 2017-07-02 NOTE — ED Triage Notes (Signed)
PT reports nausea, diarrhea, and abdominal pain for 2 days. PT reports 16 episodes diarrhea in 24 hours. No vomiting.

## 2017-07-03 NOTE — ED Provider Notes (Signed)
  [place]   [id number] 07/02/17 Arrival Time: 5809  ASSESSMENT & PLAN:  1. Diarrhea, unspecified type    Symptom care. To f/u if not showing significant improvement in the next 24-48 hours. Consider stool studies.  Meds ordered this encounter  Medications  . ondansetron (ZOFRAN-ODT) 4 MG disintegrating tablet    Sig: Take 1 tablet (4 mg total) by mouth every 8 (eight) hours as needed for nausea or vomiting.    Dispense:  15 tablet    Refill:  0    Reviewed expectations re: course of current medical issues. Questions answered. Outlined signs and symptoms indicating need for more acute intervention. Patient verbalized understanding. After Visit Summary given.   SUBJECTIVE:  Lawrence Randolph is a 46 y.o. male who presents with complaint of frequent diarrhea and abdominal cramping for 2 days. Acute onset. Nausea without vomiting. No sick contacts. Recently on vacation in [place]. No blood in stool. Overall decreased PO intake but tolerating fluids fairly well. Afebrile. No self treatment.  ROS: As per HPI.   OBJECTIVE:  Vitals:   07/02/17 1716 07/02/17 1717  BP: 124/81   Pulse: 74   Resp: 16   Temp: 99.1 F (37.3 C)   TempSrc: Oral   SpO2: 98%   Weight:  270 lb (122.5 kg)  Height:  6\' 3"  (1.905 m)     General appearance: alert; no distress HEENT: normocephalic; atraumatic; conjunctivae normal; TMs normal; nasal mucosa normal; oral mucosa normal Neck: supple Lungs: clear to auscultation bilaterally Heart: regular rate and rhythm Abdomen: soft with reported "cramping" with palpation; bowel sounds normal; no masses or organomegaly; no guarding or rebound tenderness Skin: warm and dry   No Known Allergies  PMHx, SurgHx, SocialHx, Medications, and Allergies were reviewed in the Visit Navigator and updated as appropriate.      Vanessa Kick, MD 07/03/17 1321

## 2017-07-19 ENCOUNTER — Emergency Department (HOSPITAL_COMMUNITY)
Admission: EM | Admit: 2017-07-19 | Discharge: 2017-07-19 | Disposition: A | Discharge status: Home / Self Care | Payer: Worker's Compensation | Attending: Emergency Medicine | Admitting: Emergency Medicine

## 2017-07-19 ENCOUNTER — Emergency Department (HOSPITAL_COMMUNITY): Payer: Worker's Compensation

## 2017-07-19 ENCOUNTER — Encounter (HOSPITAL_COMMUNITY): Payer: Self-pay | Admitting: Emergency Medicine

## 2017-07-19 DIAGNOSIS — T148XXA Other injury of unspecified body region, initial encounter: Secondary | ICD-10-CM

## 2017-07-19 DIAGNOSIS — Y9389 Activity, other specified: Secondary | ICD-10-CM | POA: Diagnosis not present

## 2017-07-19 DIAGNOSIS — Z87891 Personal history of nicotine dependence: Secondary | ICD-10-CM | POA: Insufficient documentation

## 2017-07-19 DIAGNOSIS — S4991XA Unspecified injury of right shoulder and upper arm, initial encounter: Secondary | ICD-10-CM | POA: Diagnosis present

## 2017-07-19 DIAGNOSIS — S20212A Contusion of left front wall of thorax, initial encounter: Secondary | ICD-10-CM

## 2017-07-19 DIAGNOSIS — Y990 Civilian activity done for income or pay: Secondary | ICD-10-CM | POA: Diagnosis not present

## 2017-07-19 DIAGNOSIS — Y929 Unspecified place or not applicable: Secondary | ICD-10-CM | POA: Diagnosis not present

## 2017-07-19 DIAGNOSIS — Z79899 Other long term (current) drug therapy: Secondary | ICD-10-CM | POA: Diagnosis not present

## 2017-07-19 DIAGNOSIS — Z7982 Long term (current) use of aspirin: Secondary | ICD-10-CM | POA: Diagnosis not present

## 2017-07-19 MED ORDER — NAPROXEN 500 MG PO TABS: 500.0000 mg | ORAL_TABLET | Freq: Two times a day (BID) | ORAL | 0 refills | Status: DC

## 2017-07-19 MED ORDER — OXYCODONE-ACETAMINOPHEN 5-325 MG PO TABS
2.0000 | ORAL_TABLET | Freq: Once | ORAL | Status: AC
  Administered 2017-07-19: 2 via ORAL
  Filled 2017-07-19: qty 2

## 2017-07-19 NOTE — Discharge Instructions (Signed)
Apply ice to help with the swelling, take Naprosyn as needed for pain, follow-up with occupational health if the symptoms persist

## 2017-07-19 NOTE — ED Triage Notes (Signed)
Patient here from home, reports that he was working under a car and failed to put car in park. Road rash to elbows and shoulders.
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
## 2018-04-23 ENCOUNTER — Other Ambulatory Visit: Payer: Self-pay | Admitting: Internal Medicine
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
## 2018-09-09 ENCOUNTER — Other Ambulatory Visit (INDEPENDENT_AMBULATORY_CARE_PROVIDER_SITE_OTHER): Payer: 59

## 2018-09-09 ENCOUNTER — Ambulatory Visit: Payer: 59 | Admitting: Internal Medicine

## 2018-09-09 ENCOUNTER — Encounter: Payer: Self-pay | Admitting: Internal Medicine

## 2018-09-09 VITALS — BP 102/70 | HR 88 | Temp 98.7°F | Resp 16 | Ht 75.0 in | Wt 273.0 lb

## 2018-09-09 DIAGNOSIS — Z0000 Encounter for general adult medical examination without abnormal findings: Secondary | ICD-10-CM | POA: Diagnosis not present

## 2018-09-09 DIAGNOSIS — E782 Mixed hyperlipidemia: Secondary | ICD-10-CM

## 2018-09-09 DIAGNOSIS — Z23 Encounter for immunization: Secondary | ICD-10-CM | POA: Diagnosis not present

## 2018-09-09 LAB — URINALYSIS, ROUTINE W REFLEX MICROSCOPIC
Bilirubin Urine: NEGATIVE
Hgb urine dipstick: NEGATIVE
Ketones, ur: NEGATIVE
Leukocytes, UA: NEGATIVE
Nitrite: NEGATIVE
RBC / HPF: NONE SEEN (ref 0–?)
Specific Gravity, Urine: 1.025 (ref 1.000–1.030)
Total Protein, Urine: NEGATIVE
URINE GLUCOSE: NEGATIVE
Urobilinogen, UA: 0.2 (ref 0.0–1.0)
WBC UA: NONE SEEN (ref 0–?)
pH: 6 (ref 5.0–8.0)

## 2018-09-09 LAB — HEPATIC FUNCTION PANEL
ALBUMIN: 4.9 g/dL (ref 3.5–5.2)
ALT: 46 U/L (ref 0–53)
AST: 32 U/L (ref 0–37)
Alkaline Phosphatase: 104 U/L (ref 39–117)
Bilirubin, Direct: 0.1 mg/dL (ref 0.0–0.3)
Total Bilirubin: 0.5 mg/dL (ref 0.2–1.2)
Total Protein: 7.8 g/dL (ref 6.0–8.3)

## 2018-09-09 LAB — CBC WITH DIFFERENTIAL/PLATELET
Basophils Absolute: 0.1 10*3/uL (ref 0.0–0.1)
Basophils Relative: 1.1 % (ref 0.0–3.0)
Eosinophils Absolute: 0.1 10*3/uL (ref 0.0–0.7)
Eosinophils Relative: 1.7 % (ref 0.0–5.0)
HCT: 45 % (ref 39.0–52.0)
Hemoglobin: 14.9 g/dL (ref 13.0–17.0)
LYMPHS ABS: 2.5 10*3/uL (ref 0.7–4.0)
LYMPHS PCT: 34.7 % (ref 12.0–46.0)
MCHC: 33.2 g/dL (ref 30.0–36.0)
MCV: 88.4 fl (ref 78.0–100.0)
MONOS PCT: 8.3 % (ref 3.0–12.0)
Monocytes Absolute: 0.6 10*3/uL (ref 0.1–1.0)
NEUTROS PCT: 54.2 % (ref 43.0–77.0)
Neutro Abs: 3.9 10*3/uL (ref 1.4–7.7)
PLATELETS: 216 10*3/uL (ref 150.0–400.0)
RBC: 5.09 Mil/uL (ref 4.22–5.81)
RDW: 14.5 % (ref 11.5–15.5)
WBC: 7.1 10*3/uL (ref 4.0–10.5)

## 2018-09-09 LAB — BASIC METABOLIC PANEL
BUN: 20 mg/dL (ref 6–23)
CALCIUM: 10.2 mg/dL (ref 8.4–10.5)
CO2: 28 meq/L (ref 19–32)
Chloride: 106 mEq/L (ref 96–112)
Creatinine, Ser: 1.17 mg/dL (ref 0.40–1.50)
GFR: 85.14 mL/min (ref >60.00)
Glucose, Bld: 93 mg/dL (ref 70–99)
Potassium: 4.7 mEq/L (ref 3.5–5.1)
Sodium: 141 mEq/L (ref 135–145)

## 2018-09-09 LAB — LIPID PANEL
Cholesterol: 251 mg/dL — ABNORMAL HIGH (ref 0–200)
HDL: 41.4 mg/dL (ref >39.00)
NONHDL: 209.12
Total CHOL/HDL Ratio: 6
Triglycerides: 222 mg/dL — ABNORMAL HIGH (ref 0.0–149.0)
VLDL: 44.4 mg/dL — ABNORMAL HIGH (ref 0.0–40.0)

## 2018-09-09 LAB — TSH: TSH: 1.48 u[IU]/mL (ref 0.35–4.50)

## 2018-09-09 LAB — PSA: PSA: 0.83 ng/mL (ref 0.10–4.00)

## 2018-09-09 LAB — LDL CHOLESTEROL, DIRECT: Direct LDL: 168 mg/dL

## 2018-09-09 MED ORDER — ROSUVASTATIN CALCIUM 20 MG PO TABS: 20.0000 mg | ORAL_TABLET | Freq: Every day | ORAL | 3 refills | Status: DC

## 2018-09-09 MED ORDER — PANTOPRAZOLE SODIUM 20 MG PO TBEC: 20.0000 mg | DELAYED_RELEASE_TABLET | Freq: Every day | ORAL | 3 refills | Status: DC

## 2018-09-09 MED ORDER — VARDENAFIL HCL 20 MG PO TABS: 20.0000 mg | ORAL_TABLET | ORAL | 11 refills | Status: DC | PRN

## 2018-09-09 NOTE — Patient Instructions (Signed)
Please continue all other medications as before, and refills have been done if requested - the protonix for stomach acid, crestor for cholesterol, and levitra  Please have the pharmacy call with any other refills you may need.  Please continue your efforts at being more active, low cholesterol diet, and weight control.  You are otherwise up to date with prevention measures today.  Please keep your appointments with your specialists as you may have planned  Please go to the LAB in the Basement (turn left off the elevator) for the tests to be done today  You will be contacted by phone if any changes need to be made immediately.  Otherwise, you will receive a letter about your results with an explanation, but please check with MyChart first.  Please remember to sign up for MyChart if you have not done so, as this will be important to you in the future with finding out test results, communicating by private email, and scheduling acute appointments online when needed.  Please return in 1 year for your yearly visit, or sooner if needed, with Lab testing done 3-5 days before

## 2018-09-09 NOTE — Progress Notes (Signed)
Subjective:    Patient ID: Lawrence Randolph, male    DOB: 1971-04-08, 47 y.o.   MRN: [id number]  HPI    Here for wellness and f/u;  Overall doing ok;  Pt denies Chest pain, worsening SOB, DOE, wheezing, orthopnea, PND, worsening LE edema, palpitations, dizziness or syncope.  Pt denies neurological change such as new headache, facial or extremity weakness.  Pt denies polydipsia, polyuria, or low sugar symptoms. Pt states overall good compliance with treatment and medications, good tolerability, and has been trying to follow appropriate diet.  Pt denies worsening depressive symptoms, suicidal ideation or panic. No fever, night sweats, wt loss, loss of appetite, or other constitutional symptoms.  Pt states good ability with ADL's, has low fall risk, home safety reviewed and adequate, no other significant changes in hearing or vision, and only occasionally active with exercise. Out of meds x 6 mo.   Wt Readings from Last 3 Encounters:  09/09/18 273 lb (123.8 kg)  07/02/17 270 lb (122.5 kg)  03/06/17 278 lb (126.1 kg)   Past Medical History:  Diagnosis Date  . Cancer ([place]) 09/28/2005   hodgkins lymphoma  . ERECTILE DYSFUNCTION 10/11/2007   Qualifier: Diagnosis of  By: Jenny Reichmann MD, [place] 06/08/2008   Qualifier: Diagnosis of  By: Jenny Reichmann MD, [place]   . HYPERLIPIDEMIA 10/18/2007   Qualifier: Diagnosis of  By: Jenny Reichmann MD, [place]   . HYPOGONADISM, MALE 10/18/2007   Qualifier: Diagnosis of  By: Jenny Reichmann MD, [place]    Past Surgical History:  Procedure Laterality Date  . right VATS     for organizing chronic inflammation RUL    reports that he has quit smoking. His smoking use included cigarettes. He has a 3.75 pack-year smoking history. He has never used smokeless tobacco. He reports that he does not drink alcohol or use drugs. family history includes Diabetes in his unknown relative; Hypertension in his unknown relative; Prostate cancer in his father, paternal grandfather, and paternal  uncle. No Known Allergies Current Outpatient Medications on File Prior to Visit  Medication Sig Dispense Refill  . aspirin EC 81 MG tablet Take 1 tablet (81 mg total) by mouth daily. 90 tablet 11  . naproxen (NAPROSYN) 500 MG tablet Take 1 tablet (500 mg total) by mouth 2 (two) times daily. 30 tablet 0  . ondansetron (ZOFRAN-ODT) 4 MG disintegrating tablet Take 1 tablet (4 mg total) by mouth every 8 (eight) hours as needed for nausea or vomiting. 15 tablet 0   No current facility-administered medications on file prior to visit.    Review of Systems Constitutional: Negative for other unusual diaphoresis, sweats, appetite or weight changes HENT: Negative for other worsening hearing loss, ear pain, facial swelling, mouth sores or neck stiffness.   Eyes: Negative for other worsening pain, redness or other visual disturbance.  Respiratory: Negative for other stridor or swelling Cardiovascular: Negative for other palpitations or other chest pain  Gastrointestinal: Negative for worsening diarrhea or loose stools, blood in stool, distention or other pain Genitourinary: Negative for hematuria, flank pain or other change in urine volume.  Musculoskeletal: Negative for myalgias or other joint swelling.  Skin: Negative for other color change, or other wound or worsening drainage.  Neurological: Negative for other syncope or numbness. Hematological: Negative for other adenopathy or swelling Psychiatric/Behavioral: Negative for hallucinations, other worsening agitation, SI, self-injury, or new decreased concentration All other system neg per pt       Physical Exam  BP 102/70   Pulse 88   Temp 98.7 F (37.1 C) (Oral)   Resp 16   Ht 6\' 3"  (1.905 m)   Wt 273 lb (123.8 kg)   SpO2 98%   BMI 34.12 kg/m  VS noted,  Constitutional: Pt is oriented to person, place, and time. Appears well-developed and well-nourished, in no significant distress and comfortable Head: Normocephalic and atraumatic  Eyes:  Conjunctivae and EOM are normal. Pupils are equal, round, and reactive to light Right Ear: External ear normal without discharge Left Ear: External ear normal without discharge Nose: Nose without discharge or deformity Mouth/Throat: Oropharynx is without other ulcerations and moist  Neck: Normal range of motion. Neck supple. No JVD present. No tracheal deviation present or significant neck LA or mass Cardiovascular: Normal rate, regular rhythm, normal heart sounds and intact distal pulses.   Pulmonary/Chest: WOB normal and breath sounds without rales or wheezing  Abdominal: Soft. Bowel sounds are normal. NT. No HSM  Musculoskeletal: Normal range of motion. Exhibits no edema Lymphadenopathy: Has no other cervical adenopathy.  Neurological: Pt is alert and oriented to person, place, and time. Pt has normal reflexes. No cranial nerve deficit. Motor grossly intact, Gait intact Skin: Skin is warm and dry. No rash noted or new ulcerations Psychiatric:  Has normal mood and affect. Behavior is normal without agitation No other exam findings  Lab Results  Component Value Date   WBC 7.1 09/09/2018   HGB 14.9 09/09/2018   HCT 45.0 09/09/2018   PLT 216.0 09/09/2018   GLUCOSE 93 09/09/2018   CHOL 251 (H) 09/09/2018   TRIG 222.0 (H) 09/09/2018   HDL 41.40 09/09/2018   LDLDIRECT 168.0 09/09/2018   LDLCALC 154 (H) 03/06/2017   ALT 46 09/09/2018   AST 32 09/09/2018   NA 141 09/09/2018   K 4.7 09/09/2018   CL 106 09/09/2018   CREATININE 1.17 09/09/2018   BUN 20 09/09/2018   CO2 28 09/09/2018   TSH 1.48 09/09/2018   PSA 0.83 09/09/2018      Assessment & Plan:

## 2018-09-10 NOTE — Assessment & Plan Note (Signed)
To restart statin,  to f/u any worsening symptoms or concerns

## 2018-09-10 NOTE — Assessment & Plan Note (Signed)

## 2019-09-16 ENCOUNTER — Encounter: Payer: Self-pay | Admitting: Internal Medicine

## 2019-09-16 ENCOUNTER — Other Ambulatory Visit (INDEPENDENT_AMBULATORY_CARE_PROVIDER_SITE_OTHER): Payer: 59

## 2019-09-16 ENCOUNTER — Ambulatory Visit (INDEPENDENT_AMBULATORY_CARE_PROVIDER_SITE_OTHER): Payer: 59 | Admitting: Internal Medicine

## 2019-09-16 ENCOUNTER — Other Ambulatory Visit: Payer: Self-pay

## 2019-09-16 VITALS — BP 128/84 | HR 84 | Temp 98.6°F | Ht 75.0 in | Wt 284.0 lb

## 2019-09-16 DIAGNOSIS — E611 Iron deficiency: Secondary | ICD-10-CM | POA: Diagnosis not present

## 2019-09-16 DIAGNOSIS — E538 Deficiency of other specified B group vitamins: Secondary | ICD-10-CM

## 2019-09-16 DIAGNOSIS — Z0000 Encounter for general adult medical examination without abnormal findings: Secondary | ICD-10-CM | POA: Diagnosis not present

## 2019-09-16 DIAGNOSIS — Z23 Encounter for immunization: Secondary | ICD-10-CM | POA: Diagnosis not present

## 2019-09-16 DIAGNOSIS — E559 Vitamin D deficiency, unspecified: Secondary | ICD-10-CM

## 2019-09-16 DIAGNOSIS — Z0001 Encounter for general adult medical examination with abnormal findings: Secondary | ICD-10-CM | POA: Diagnosis not present

## 2019-09-16 DIAGNOSIS — A6000 Herpesviral infection of urogenital system, unspecified: Secondary | ICD-10-CM | POA: Diagnosis not present

## 2019-09-16 DIAGNOSIS — E782 Mixed hyperlipidemia: Secondary | ICD-10-CM | POA: Diagnosis not present

## 2019-09-16 DIAGNOSIS — R252 Cramp and spasm: Secondary | ICD-10-CM

## 2019-09-16 LAB — CBC WITH DIFFERENTIAL/PLATELET
Basophils Absolute: 0.1 10*3/uL (ref 0.0–0.1)
Basophils Relative: 0.9 % (ref 0.0–3.0)
Eosinophils Absolute: 0.1 10*3/uL (ref 0.0–0.7)
Eosinophils Relative: 1.8 % (ref 0.0–5.0)
HCT: 45.3 % (ref 39.0–52.0)
Hemoglobin: 14.7 g/dL (ref 13.0–17.0)
Lymphocytes Relative: 35.8 % (ref 12.0–46.0)
Lymphs Abs: 2.4 10*3/uL (ref 0.7–4.0)
MCHC: 32.4 g/dL (ref 30.0–36.0)
MCV: 89.5 fl (ref 78.0–100.0)
Monocytes Absolute: 0.6 10*3/uL (ref 0.1–1.0)
Monocytes Relative: 8.5 % (ref 3.0–12.0)
Neutro Abs: 3.5 10*3/uL (ref 1.4–7.7)
Neutrophils Relative %: 53 % (ref 43.0–77.0)
Platelets: 209 10*3/uL (ref 150.0–400.0)
RBC: 5.05 Mil/uL (ref 4.22–5.81)
RDW: 14.6 % (ref 11.5–15.5)
WBC: 6.6 10*3/uL (ref 4.0–10.5)

## 2019-09-16 LAB — LIPID PANEL
Cholesterol: 139 mg/dL (ref 0–200)
HDL: 41 mg/dL (ref >39.00)
LDL Cholesterol: 63 mg/dL (ref 0–99)
NonHDL: 97.87
Total CHOL/HDL Ratio: 3
Triglycerides: 176 mg/dL — ABNORMAL HIGH (ref 0.0–149.0)
VLDL: 35.2 mg/dL (ref 0.0–40.0)

## 2019-09-16 LAB — VITAMIN D 25 HYDROXY (VIT D DEFICIENCY, FRACTURES): VITD: 21.49 ng/mL — ABNORMAL LOW (ref 30.00–100.00)

## 2019-09-16 LAB — HEPATIC FUNCTION PANEL
ALT: 71 U/L — ABNORMAL HIGH (ref 0–53)
AST: 46 U/L — ABNORMAL HIGH (ref 0–37)
Albumin: 4.6 g/dL (ref 3.5–5.2)
Alkaline Phosphatase: 98 U/L (ref 39–117)
Bilirubin, Direct: 0.1 mg/dL (ref 0.0–0.3)
Total Bilirubin: 0.4 mg/dL (ref 0.2–1.2)
Total Protein: 7.1 g/dL (ref 6.0–8.3)

## 2019-09-16 LAB — URINALYSIS, ROUTINE W REFLEX MICROSCOPIC
Bilirubin Urine: NEGATIVE
Hgb urine dipstick: NEGATIVE
Ketones, ur: NEGATIVE
Leukocytes,Ua: NEGATIVE
Nitrite: NEGATIVE
RBC / HPF: NONE SEEN (ref 0–?)
Specific Gravity, Urine: 1.025 (ref 1.000–1.030)
Total Protein, Urine: NEGATIVE
Urine Glucose: NEGATIVE
Urobilinogen, UA: 0.2 (ref 0.0–1.0)
pH: 6 (ref 5.0–8.0)

## 2019-09-16 LAB — BASIC METABOLIC PANEL
BUN: 18 mg/dL (ref 6–23)
CO2: 27 mEq/L (ref 19–32)
Calcium: 9.9 mg/dL (ref 8.4–10.5)
Chloride: 106 mEq/L (ref 96–112)
Creatinine, Ser: 1.1 mg/dL (ref 0.40–1.50)
GFR: 85.66 mL/min (ref >60.00)
Glucose, Bld: 93 mg/dL (ref 70–99)
Potassium: 4 mEq/L (ref 3.5–5.1)
Sodium: 141 mEq/L (ref 135–145)

## 2019-09-16 LAB — PSA: PSA: 0.67 ng/mL (ref 0.10–4.00)

## 2019-09-16 LAB — IBC PANEL
Iron: 93 ug/dL (ref 42–165)
Saturation Ratios: 24.9 % (ref 20.0–50.0)
Transferrin: 267 mg/dL (ref 212.0–360.0)

## 2019-09-16 LAB — VITAMIN B12: Vitamin B-12: 471 pg/mL (ref 211–911)

## 2019-09-16 LAB — TSH: TSH: 1.75 u[IU]/mL (ref 0.35–4.50)

## 2019-09-16 LAB — MAGNESIUM: Magnesium: 1.8 mg/dL (ref 1.5–2.5)

## 2019-09-16 MED ORDER — VARDENAFIL HCL 20 MG PO TABS: 20.0000 mg | ORAL_TABLET | ORAL | 11 refills | Status: DC | PRN

## 2019-09-16 MED ORDER — PANTOPRAZOLE SODIUM 20 MG PO TBEC: 20.0000 mg | DELAYED_RELEASE_TABLET | Freq: Every day | ORAL | 3 refills | Status: DC

## 2019-09-16 MED ORDER — VALACYCLOVIR HCL 1 G PO TABS: 1000.0000 mg | ORAL_TABLET | Freq: Two times a day (BID) | ORAL | 5 refills | Status: DC

## 2019-09-16 MED ORDER — ROSUVASTATIN CALCIUM 20 MG PO TABS: 20.0000 mg | ORAL_TABLET | Freq: Every day | ORAL | 3 refills | Status: DC

## 2019-09-16 NOTE — Patient Instructions (Addendum)
You had the flu shot today  You will be contacted regarding the referral for: colonoscopy  Please take all new medication as prescribed - the antibiotic for the rash  Please continue all other medications as before, and refills have been done if requested.  Please have the pharmacy call with any other refills you may need.  Please continue your efforts at being more active, low cholesterol diet, and weight control.  You are otherwise up to date with prevention measures today.  Please keep your appointments with your specialists as you may have planned  Please go to the LAB in the Basement (turn left off the elevator) for the tests to be done today  You will be contacted by phone if any changes need to be made immediately.  Otherwise, you will receive a letter about your results with an explanation, but please check with MyChart first.  Please remember to sign up for MyChart if you have not done so, as this will be important to you in the future with finding out test results, communicating by private email, and scheduling acute appointments online when needed.  Please return in 1 year for your yearly visit, or sooner if needed, with Lab testing done 3-5 days before

## 2019-09-16 NOTE — Progress Notes (Signed)
Subjective:    Patient ID: Lawrence Randolph, male    DOB: [birth year], 48 y.o.   MRN: [id number]  HPI  Here for wellness and f/u;  Overall doing ok;  Pt denies Chest pain, worsening SOB, DOE, wheezing, orthopnea, PND, worsening LE edema, palpitations, dizziness or syncope.  Pt denies neurological change such as new headache, facial or extremity weakness.  Pt denies polydipsia, polyuria, or low sugar symptoms. Pt states overall good compliance with treatment and medications, good tolerability, and has been trying to follow appropriate diet.  Pt denies worsening depressive symptoms, suicidal ideation or panic. No fever, night sweats, wt loss, loss of appetite, or other constitutional symptoms.  Pt states good ability with ADL's, has low fall risk, home safety reviewed and adequate, no other significant changes in hearing or vision, and only occasionally active with exercise. Tolerated and taking statin daily.   Also has Hx of genital herpes since 48yo, with current outbreak with large rash genital with pain  3 day, moderat, constant, nothing makes better or worse.  Also c/o 1 mo onset recurring hand cramps usually in the AM, but all day as well, mild to mod, intermittent, maybe worse after using the hands all day, nothing else really makes better or worse Past Medical History:  Diagnosis Date   Cancer ([place]) 09/28/2005   hodgkins lymphoma   ERECTILE DYSFUNCTION 10/11/2007   Qualifier: Diagnosis of  By: Jenny Reichmann MD, Roxie 06/08/2008   Qualifier: Diagnosis of  By: Jenny Reichmann MD, Hunt Oris    HYPERLIPIDEMIA 10/18/2007   Qualifier: Diagnosis of  By: Jenny Reichmann MD, Shannan Harper, MALE 10/18/2007   Qualifier: Diagnosis of  By: Jenny Reichmann MD, Hunt Oris    Past Surgical History:  Procedure Laterality Date   right VATS     for organizing chronic inflammation RUL    reports that he has quit smoking. His smoking use included cigarettes. He has a 3.75 pack-year smoking history. He has never used  smokeless tobacco. He reports that he does not drink alcohol or use drugs. family history includes Diabetes in his unknown relative; Hypertension in his unknown relative; Prostate cancer in his father, paternal grandfather, and paternal uncle. No Known Allergies Current Outpatient Medications on File Prior to Visit  Medication Sig Dispense Refill   aspirin EC 81 MG tablet Take 1 tablet (81 mg total) by mouth daily. 90 tablet 11   naproxen (NAPROSYN) 500 MG tablet Take 1 tablet (500 mg total) by mouth 2 (two) times daily. 30 tablet 0   ondansetron (ZOFRAN-ODT) 4 MG disintegrating tablet Take 1 tablet (4 mg total) by mouth every 8 (eight) hours as needed for nausea or vomiting. 15 tablet 0   No current facility-administered medications on file prior to visit.    Review of Systems Constitutional: Negative for other unusual diaphoresis, sweats, appetite or weight changes HENT: Negative for other worsening hearing loss, ear pain, facial swelling, mouth sores or neck stiffness.   Eyes: Negative for other worsening pain, redness or other visual disturbance.  Respiratory: Negative for other stridor or swelling Cardiovascular: Negative for other palpitations or other chest pain  Gastrointestinal: Negative for worsening diarrhea or loose stools, blood in stool, distention or other pain Genitourinary: Negative for hematuria, flank pain or other change in urine volume.  Musculoskeletal: Negative for myalgias or other joint swelling.  Skin: Negative for other color change, or other wound or worsening drainage.  Neurological: Negative for other syncope or  numbness. Hematological: Negative for other adenopathy or swelling Psychiatric/Behavioral: Negative for hallucinations, other worsening agitation, SI, self-injury, or new decreased concentration All otherwise neg per pt    Objective:   Physical Exam BP 128/84    Pulse 84    Temp 98.6 F (37 C) (Oral)    Ht 6\' 3"  (1.905 m)    Wt 284 lb (128.8 kg)     SpO2 96%    BMI 35.50 kg/m  VS noted,  Constitutional: Pt is oriented to person, place, and time. Appears well-developed and well-nourished, in no significant distress and comfortable Head: Normocephalic and atraumatic  Eyes: Conjunctivae and EOM are normal. Pupils are equal, round, and reactive to light Right Ear: External ear normal without discharge Left Ear: External ear normal without discharge Nose: Nose without discharge or deformity Mouth/Throat: Oropharynx is without other ulcerations and moist  Neck: Normal range of motion. Neck supple. No JVD present. No tracheal deviation present or significant neck LA or mass Cardiovascular: Normal rate, regular rhythm, normal heart sounds and intact distal pulses.   Pulmonary/Chest: WOB normal and breath sounds without rales or wheezing  Abdominal: Soft. Bowel sounds are normal. NT. No HSM  Musculoskeletal: Normal range of motion. Exhibits no edema Lymphadenopathy: Has no other cervical adenopathy.  Neurological: Pt is alert and oriented to person, place, and time. Pt has normal reflexes. No cranial nerve deficit. Motor grossly intact, Gait intact Skin: Skin is warm and dry. No rash noted or new ulcerations Psychiatric:  Has normal mood and affect. Behavior is normal without agitation All otherwise neg per pt  Lab Results  Component Value Date   WBC 6.6 09/16/2019   HGB 14.7 09/16/2019   HCT 45.3 09/16/2019   PLT 209.0 09/16/2019   GLUCOSE 93 09/16/2019   CHOL 139 09/16/2019   TRIG 176.0 (H) 09/16/2019   HDL 41.00 09/16/2019   LDLDIRECT 168.0 09/09/2018   LDLCALC 63 09/16/2019   ALT 71 (H) 09/16/2019   AST 46 (H) 09/16/2019   NA 141 09/16/2019   K 4.0 09/16/2019   CL 106 09/16/2019   CREATININE 1.10 09/16/2019   BUN 18 09/16/2019   CO2 27 09/16/2019   TSH 1.75 09/16/2019   PSA 0.67 09/16/2019       Assessment & Plan:

## 2019-09-17 ENCOUNTER — Other Ambulatory Visit: Payer: Self-pay | Admitting: Internal Medicine

## 2019-09-17 ENCOUNTER — Encounter: Payer: Self-pay | Admitting: Internal Medicine

## 2019-09-17 DIAGNOSIS — Z0001 Encounter for general adult medical examination with abnormal findings: Secondary | ICD-10-CM | POA: Insufficient documentation

## 2019-09-17 DIAGNOSIS — E559 Vitamin D deficiency, unspecified: Secondary | ICD-10-CM | POA: Insufficient documentation

## 2019-09-17 MED ORDER — VITAMIN D (ERGOCALCIFEROL) 1.25 MG (50000 UNIT) PO CAPS: 50000.0000 [IU] | ORAL_CAPSULE | ORAL | 0 refills | Status: DC

## 2019-09-17 NOTE — Assessment & Plan Note (Addendum)
mod, for antibx course,  to f/u any worsening symptoms or concerns  In addition to the time spent performing CPE, I spent an additional 15 minutes face to face,in which greater than 50% of this time was spent in counseling and coordination of care for patient's illness as documented, including the differential dx, treatment, further evaluation and other management of genital herpes outbreak, hand cramp, HLD

## 2019-09-17 NOTE — Assessment & Plan Note (Signed)
?   Due to overuse, for magnesium, avoid hydration

## 2019-09-17 NOTE — Assessment & Plan Note (Signed)
Lab Results  Component Value Date   LDLCALC 63 09/16/2019  stable overall by history and exam, recent data reviewed with pt, and pt to continue medical treatment as before,  to f/u any worsening symptoms or concerns

## 2019-09-21 ENCOUNTER — Encounter: Payer: Self-pay | Admitting: Gastroenterology

## 2019-10-06 ENCOUNTER — Encounter: Payer: Self-pay | Admitting: Gastroenterology

## 2019-10-06 ENCOUNTER — Other Ambulatory Visit: Payer: Self-pay

## 2019-10-06 ENCOUNTER — Ambulatory Visit (AMBULATORY_SURGERY_CENTER): Payer: Self-pay | Admitting: *Deleted

## 2019-10-06 VITALS — Temp 97.6°F | Ht 75.0 in | Wt 281.6 lb

## 2019-10-06 DIAGNOSIS — Z1211 Encounter for screening for malignant neoplasm of colon: Secondary | ICD-10-CM

## 2019-10-06 DIAGNOSIS — Z1159 Encounter for screening for other viral diseases: Secondary | ICD-10-CM

## 2019-10-06 MED ORDER — SUPREP BOWEL PREP KIT 17.5-3.13-1.6 GM/177ML PO SOLN: 1.0000 | Freq: Once | ORAL | 0 refills | Status: AC

## 2019-10-06 NOTE — Progress Notes (Signed)
Patient denies any allergies to egg or soy products. Patient denies complications with anesthesia/sedation.  Patient denies oxygen use at home and denies diet medications. Emmi instructions for colonoscopy/endoscopy explained and given to patient.  Suprep coupon given.   

## 2019-10-13 LAB — SARS CORONAVIRUS 2 (TAT 6-24 HRS): SARS Coronavirus 2: NEGATIVE

## 2019-10-17 ENCOUNTER — Ambulatory Visit (AMBULATORY_SURGERY_CENTER): Payer: 59 | Admitting: Gastroenterology

## 2019-10-17 ENCOUNTER — Other Ambulatory Visit: Payer: Self-pay

## 2019-10-17 ENCOUNTER — Encounter: Payer: Self-pay | Admitting: Gastroenterology

## 2019-10-17 VITALS — BP 124/72 | HR 56 | Temp 97.7°F | Resp 14 | Ht 75.0 in | Wt 281.0 lb

## 2019-10-17 DIAGNOSIS — Z1211 Encounter for screening for malignant neoplasm of colon: Secondary | ICD-10-CM

## 2019-10-17 DIAGNOSIS — D120 Benign neoplasm of cecum: Secondary | ICD-10-CM

## 2019-10-17 DIAGNOSIS — D124 Benign neoplasm of descending colon: Secondary | ICD-10-CM | POA: Diagnosis not present

## 2019-10-17 MED ORDER — SODIUM CHLORIDE 0.9 % IV SOLN: 500.0000 mL | Freq: Once | INTRAVENOUS | Status: DC

## 2019-10-17 NOTE — Patient Instructions (Signed)
Thank you for allowing [place] to care for you today!  Await pathology results by mail, approximately 2 weeks.  Recommendation for next colonoscopy will be made after pathology complete.  Resume previous diet and medications today.  Resume your normal activities tomorrow.      YOU HAD AN ENDOSCOPIC PROCEDURE TODAY AT [facility]:   Refer to the procedure report that was given to you for any specific questions about what was found during the examination.  If the procedure report does not answer your questions, please call your gastroenterologist to clarify.  If you requested that your care partner not be given the details of your procedure findings, then the procedure report has been included in a sealed envelope for you to review at your convenience later.  YOU SHOULD EXPECT: Some feelings of bloating in the abdomen. Passage of more gas than usual.  Walking can help get rid of the air that was put into your GI tract during the procedure and reduce the bloating. If you had a lower endoscopy (such as a colonoscopy or flexible sigmoidoscopy) you may notice spotting of blood in your stool or on the toilet paper. If you underwent a bowel prep for your procedure, you may not have a normal bowel movement for a few days.  Please Note:  You might notice some irritation and congestion in your nose or some drainage.  This is from the oxygen used during your procedure.  There is no need for concern and it should clear up in a day or so.  SYMPTOMS TO REPORT IMMEDIATELY:   Following lower endoscopy (colonoscopy or flexible sigmoidoscopy):  Excessive amounts of blood in the stool  Significant tenderness or worsening of abdominal pains  Swelling of the abdomen that is new, acute  Fever of 100F or higher   For urgent or emergent issues, a gastroenterologist can be reached at any hour by calling [phone].   DIET:  We do recommend a small meal at first, but then you may proceed to your  regular diet.  Drink plenty of fluids but you should avoid alcoholic beverages for 24 hours.  ACTIVITY:  You should plan to take it easy for the rest of today and you should NOT DRIVE or use heavy machinery until tomorrow (because of the sedation medicines used during the test).    FOLLOW UP: Our staff will call the number listed on your records 48-72 hours following your procedure to check on you and address any questions or concerns that you may have regarding the information given to you following your procedure. If we do not reach you, we will leave a message.  We will attempt to reach you two times.  During this call, we will ask if you have developed any symptoms of COVID 19. If you develop any symptoms (ie: fever, flu-like symptoms, shortness of breath, cough etc.) before then, please call [phone].  If you test positive for Covid 19 in the 2 weeks post procedure, please call and report this information to [place].    If any biopsies were taken you will be contacted by phone or by letter within the next 1-3 weeks.  Please call us at [phone] if you have not heard about the biopsies in 3 weeks.    SIGNATURES/CONFIDENTIALITY: You and/or your care partner have signed paperwork which will be entered into your electronic medical record.  These signatures attest to the fact that that the information above on your After Visit Summary has  been reviewed and is understood.  Full responsibility of the confidentiality of this discharge information lies with you and/or your care-partner.

## 2019-10-17 NOTE — Progress Notes (Signed)
Report given to PACU, vss 

## 2019-10-17 NOTE — Progress Notes (Signed)
Called to room to assist during endoscopic procedure.  Patient ID and intended procedure confirmed with present staff. Received instructions for my participation in the procedure from the performing physician.  

## 2019-10-17 NOTE — Op Note (Signed)
[name] Patient Name: Lawrence Randolph Procedure Date: 10/17/2019 11:09 AM MRN: [id number] Endoscopist: Ladene Artist , MD Age: 48 Referring MD:  Date of Birth: 1971-07-13 Gender: Male Account #: [id number] Procedure:                Colonoscopy Indications:              Screening for colorectal malignant neoplasm Medicines:                Monitored Anesthesia Care Procedure:                Pre-Anesthesia Assessment:                           - Prior to the procedure, a History and Physical                            was performed, and patient medications and                            allergies were reviewed. The patient's tolerance of                            previous anesthesia was also reviewed. The risks                            and benefits of the procedure and the sedation                            options and risks were discussed with the patient.                            All questions were answered, and informed consent                            was obtained. Prior Anticoagulants: The patient has                            taken no previous anticoagulant or antiplatelet                            agents. ASA Grade Assessment: II - A patient with                            mild systemic disease. After reviewing the risks                            and benefits, the patient was deemed in                            satisfactory condition to undergo the procedure.                           After obtaining informed consent, the colonoscope  was passed under direct vision. Throughout the                            procedure, the patient's blood pressure, pulse, and                            oxygen saturations were monitored continuously. The                            Colonoscope was introduced through the anus and                            advanced to the the cecum, identified by                            appendiceal orifice and  ileocecal valve. The                            ileocecal valve, appendiceal orifice, and rectum                            were photographed. The quality of the bowel                            preparation was good. The colonoscopy was performed                            without difficulty. The patient tolerated the                            procedure well. Scope In: 11:16:27 AM Scope Out: 11:30:17 AM Scope Withdrawal Time: 0 hours 11 minutes 54 seconds  Total Procedure Duration: 0 hours 13 minutes 50 seconds  Findings:                 The perianal and digital rectal examinations were                            normal.                           A 3 mm polyp was found in the cecum. The polyp was                            sessile. The polyp was removed with a cold biopsy                            forceps. Resection and retrieval were complete.                           A 7 mm polyp was found in the descending colon. The                            polyp was sessile. The polyp was removed with a  cold snare. Resection and retrieval were complete.                           Internal hemorrhoids were found during                            retroflexion. The hemorrhoids were small and Grade                            I (internal hemorrhoids that do not prolapse).                           The exam was otherwise without abnormality on                            direct and retroflexion views. Complications:            No immediate complications. Estimated blood loss:                            None. Estimated Blood Loss:     Estimated blood loss: none. Impression:               - One 3 mm polyp in the cecum, removed with a cold                            biopsy forceps. Resected and retrieved.                           - One 7 mm polyp in the descending colon, removed                            with a cold snare. Resected and retrieved.                           -  Internal hemorrhoids.                           - The examination was otherwise normal on direct                            and retroflexion views. Recommendation:           - Repeat colonoscopy date to be determined after                            pending pathology results are reviewed for                            surveillance.                           - Patient has a contact number available for                            emergencies. The signs and symptoms of potential  delayed complications were discussed with the                            patient. Return to normal activities tomorrow.                            Written discharge instructions were provided to the                            patient.                           - Resume previous diet.                           - Continue present medications.                           - Await pathology results. Ladene Artist, MD 10/17/2019 11:33:08 AM This report has been signed electronically.

## 2019-10-17 NOTE — Progress Notes (Signed)
Temp check by:LC Vital check by:CW  The patient states no changes in medical or surgical history since pre-visit screening on 10/06/2019.

## 2019-10-19 ENCOUNTER — Telehealth: Payer: Self-pay

## 2019-10-19 NOTE — Telephone Encounter (Signed)
First attempt follow up call to pt, lm on vm 

## 2019-10-19 NOTE — Telephone Encounter (Signed)
  Follow up Call-  Call back number 10/17/2019  Post procedure Call Back phone  # [phone]  Permission to leave phone message Yes  Some recent data might be hidden     Patient questions:  Do you have a fever, pain , or abdominal swelling? No. Pain Score  0 *  Have you tolerated food without any problems? Yes.    Have you been able to return to your normal activities? Yes.    Do you have any questions about your discharge instructions: Diet   No. Medications  No. Follow up visit  No.  Do you have questions or concerns about your Care? No.  Actions: * If pain score is 4 or above: No action needed, pain <4.  1. Have you developed a fever since your procedure? no  2.   Have you had an respiratory symptoms (SOB or cough) since your procedure? no  3.   Have you tested positive for COVID 19 since your procedure no  4.   Have you had any family members/close contacts diagnosed with the COVID 19 since your procedure?  no   If yes to any of these questions please route to Joylene John, RN and Alphonsa Gin, Therapist, sports.

## 2019-10-28 ENCOUNTER — Encounter: Payer: Self-pay | Admitting: Gastroenterology

## 2019-11-01 ENCOUNTER — Encounter: Payer: Self-pay | Admitting: *Deleted

## 2019-11-01 ENCOUNTER — Ambulatory Visit
Admission: EM | Admit: 2019-11-01 | Discharge: 2019-11-01 | Disposition: A | Discharge status: Home / Self Care | Payer: 59 | Attending: Emergency Medicine | Admitting: Emergency Medicine

## 2019-11-01 ENCOUNTER — Other Ambulatory Visit: Payer: Self-pay

## 2019-11-01 DIAGNOSIS — M545 Low back pain, unspecified: Secondary | ICD-10-CM

## 2019-11-01 DIAGNOSIS — R030 Elevated blood-pressure reading, without diagnosis of hypertension: Secondary | ICD-10-CM

## 2019-11-01 DIAGNOSIS — Z0189 Encounter for other specified special examinations: Secondary | ICD-10-CM | POA: Diagnosis not present

## 2019-11-01 MED ORDER — IBUPROFEN 800 MG PO TABS: 800.0000 mg | ORAL_TABLET | Freq: Three times a day (TID) | ORAL | 0 refills | Status: DC | PRN

## 2019-11-01 MED ORDER — CYCLOBENZAPRINE HCL 10 MG PO TABS: 10.0000 mg | ORAL_TABLET | Freq: Two times a day (BID) | ORAL | 0 refills | Status: DC | PRN

## 2019-11-01 NOTE — Discharge Instructions (Addendum)
Take the prescribed ibuprofen as needed for your pain.  Take the muscle relaxer Flexeril as needed for muscle spasm; do not drive, operate machinery, or drink alcohol with this medication as it may make you drowsy.    Follow up with an orthopedist if your pain is not improving.  Go to the emergency department if you have worsening pain or develop new symptoms such as difficulty with urination, weakness, numbness, loss of control of your bladder or bowels, fever, or chills.   Your blood pressure is elevated today at 137/95.  Please have this rechecked by your primary care provider in 2-4 weeks.    Your COVID test is pending.  You should self quarantine until your test result is back and is negative.    Go to the emergency department if you develop Hauk fever, shortness of breath, severe diarrhea, or other concerning symptoms.

## 2019-11-01 NOTE — ED Triage Notes (Addendum)
Patient also reports 2 week history of back pain, states he has had back pain since colonoscopy. Patient reports pain is worse with twisting. No injury, describes it as a pinching sensation.

## 2019-11-01 NOTE — ED Triage Notes (Signed)
Patient would like to have COVID test, no symptoms. Has not been exposed that he is aware of.

## 2019-11-01 NOTE — ED Provider Notes (Signed)
Lawrence Randolph    CSN: [id number] Arrival date & time: 11/01/19  1214      History   Chief Complaint Chief Complaint  Patient presents with  . COVID Test  . Back Pain    HPI Lawrence Randolph is a 48 y.o. male.   Patient presents with lower back pain x2 weeks.  He reports the pain is worse with twisting and improves with rest.  He denies numbness, tingling, weakness, fever, chills, abdominal pain, dysuria, incontinence of bowel/bladder or other symptoms.  Patient also requests a COVID test today as his wife has a cough.  The history is provided by the patient.    Past Medical History:  Diagnosis Date  . Cancer ([place]) 09/28/2005   hodgkins lymphoma  stage I Hodgkin's disease.  Marland Kitchen ERECTILE DYSFUNCTION 10/11/2007   Qualifier: Diagnosis of  By: Jenny Reichmann MD, [place] 06/08/2008   Qualifier: Diagnosis of  By: Jenny Reichmann MD, Hunt Oris   . GERD (gastroesophageal reflux disease)   . HYPERLIPIDEMIA 10/18/2007   Qualifier: Diagnosis of  By: Jenny Reichmann MD, Hunt Oris   . HYPOGONADISM, MALE 10/18/2007   Qualifier: Diagnosis of  By: Jenny Reichmann MD, Hunt Oris     Patient Active Problem List   Diagnosis Date Noted  . Encounter for well adult exam with abnormal findings 09/17/2019  . Hand cramps 09/16/2019  . Carpal tunnel syndrome 10/17/2015  . Prostatitis 10/12/2013  . Hodgkin's disease ([place])   . Genital herpes 06/08/2008  . HYPOGONADISM, MALE 10/18/2007  . HYPERLIPIDEMIA 10/18/2007  . ERECTILE DYSFUNCTION 10/11/2007    Past Surgical History:  Procedure Laterality Date  . NECK SURGERY Right 04/2011   biopsy - stage I Hodgkin's disease.  . right VATS  04/2011   for organizing chronic inflammation RUL       Home Medications    Prior to Admission medications   Medication Sig Start Date End Date Taking? Authorizing Provider  pantoprazole (PROTONIX) 20 MG tablet Take 1 tablet (20 mg total) by mouth daily. 09/16/19  Yes Biagio Borg, MD  rosuvastatin (CRESTOR) 20 MG tablet Take 1  tablet (20 mg total) by mouth daily. 09/16/19  Yes Biagio Borg, MD  valACYclovir (VALTREX) 1000 MG tablet Take 1 tablet (1,000 mg total) by mouth 2 (two) times daily. 09/16/19  Yes Biagio Borg, MD  vardenafil (LEVITRA) 20 MG tablet Take 1 tablet (20 mg total) by mouth as needed for erectile dysfunction. 09/16/19  Yes Biagio Borg, MD  Vitamin D, Ergocalciferol, (DRISDOL) 1.25 MG (50000 UT) CAPS capsule Take 1 capsule (50,000 Units total) by mouth every 7 (seven) days. 09/17/19  Yes Biagio Borg, MD  cyclobenzaprine (FLEXERIL) 10 MG tablet Take 1 tablet (10 mg total) by mouth 2 (two) times daily as needed for muscle spasms. 11/01/19   Sharion Balloon, NP  ibuprofen (ADVIL) 800 MG tablet Take 1 tablet (800 mg total) by mouth every 8 (eight) hours as needed. 11/01/19   Sharion Balloon, NP    Family History Family History  Problem Relation Age of Onset  . Prostate cancer Father   . Hypertension Other   . Diabetes Other   . Prostate cancer Paternal Grandfather   . Prostate cancer Paternal Uncle   . Colon cancer Neg Hx   . Rectal cancer Neg Hx   . Stomach cancer Neg Hx     Social History Social History   Tobacco Use  . Smoking status: Former Smoker  Packs/day: 0.25    Years: 15.00    Pack years: 3.75    Types: Cigarettes    Quit date: 02/12/2001    Years since quitting: 18.7  . Smokeless tobacco: Never Used  Substance Use Topics  . Alcohol use: No  . Drug use: No     Allergies   Patient has no known allergies.   Review of Systems Review of Systems  Constitutional: Negative for chills and fever.  HENT: Negative for ear pain and sore throat.   Eyes: Negative for pain and visual disturbance.  Respiratory: Negative for cough and shortness of breath.   Cardiovascular: Negative for chest pain and palpitations.  Gastrointestinal: Negative for abdominal pain, diarrhea, nausea and vomiting.  Genitourinary: Negative for dysuria and hematuria.  Musculoskeletal: Positive for back  pain. Negative for arthralgias.  Skin: Negative for color change and rash.  Neurological: Negative for seizures, syncope, weakness and numbness.  All other systems reviewed and are negative.    Physical Exam Triage Vital Signs ED Triage Vitals [11/01/19 1216]  Enc Vitals Group     BP      Pulse      Resp      Temp      Temp src      SpO2      Weight      Height      Head Circumference      Peak Flow      Pain Score 8     Pain Loc      Pain Edu?      Excl. in [place]?    No data found.  Updated Vital Signs BP (!) 137/95 (BP Location: Left Arm)   Pulse 70   Temp 98.5 F (36.9 C) (Oral)   Resp 16   SpO2 98%   Visual Acuity Right Eye Distance:   Left Eye Distance:   Bilateral Distance:    Right Eye Near:   Left Eye Near:    Bilateral Near:     Physical Exam Vitals signs and nursing note reviewed.  Constitutional:      Appearance: He is well-developed.  HENT:     Head: Normocephalic and atraumatic.     Right Ear: Tympanic membrane normal.     Left Ear: Tympanic membrane normal.     Nose: Nose normal.     Mouth/Throat:     Mouth: Mucous membranes are moist.     Pharynx: Oropharynx is clear.  Eyes:     Conjunctiva/sclera: Conjunctivae normal.  Neck:     Musculoskeletal: Neck supple.  Cardiovascular:     Rate and Rhythm: Normal rate and regular rhythm.     Heart sounds: No murmur.  Pulmonary:     Effort: Pulmonary effort is normal. No respiratory distress.     Breath sounds: Normal breath sounds.  Abdominal:     General: Bowel sounds are normal. There is no distension.     Palpations: Abdomen is soft.     Tenderness: There is no abdominal tenderness. There is no right CVA tenderness, left CVA tenderness, guarding or rebound.  Musculoskeletal: Normal range of motion.        General: No swelling, tenderness, deformity or signs of injury.  Skin:    General: Skin is warm and dry.     Capillary Refill: Capillary refill takes less than 2 seconds.     Findings:  No bruising, erythema, lesion or rash.  Neurological:     General: No focal deficit present.  Mental Status: He is alert and oriented to person, place, and time.     Sensory: No sensory deficit.     Motor: No weakness.     Gait: Gait normal.  Psychiatric:        Mood and Affect: Mood normal.        Behavior: Behavior normal.      UC Treatments / Results  Labs (all labs ordered are listed, but only abnormal results are displayed) Labs Reviewed  NOVEL CORONAVIRUS, NAA    EKG   Radiology No results found.  Procedures Procedures (including critical care time)  Medications Ordered in UC Medications - No data to display  Initial Impression / Assessment and Plan / UC Course  I have reviewed the triage vital signs and the nursing notes.  Pertinent labs & imaging results that were available during my care of the patient were reviewed by me and considered in my medical decision making (see chart for details).    Acute bilateral low back pain without sciatica.  Patient request for a COVID test.  Elevated blood pressure reading.  Treating back pain with ibuprofen and Flexeril.  Precautions for drowsiness with Flexeril discussed with patient.  Instructed him to follow-up with an orthopedist if his pain is not improving.  Discussed with patient that his blood pressure is elevated today needs to be rechecked by his PCP in 2 to 4 weeks.  COVID test performed here.  Instructed patient to self quarantine until the test result is back.  Instructed patient to go to the emergency department if he develops [place] fever, shortness of breath, severe diarrhea, or other concerning symptoms.  Patient agrees with plan of care.   Final Clinical Impressions(s) / UC Diagnoses   Final diagnoses:  Acute bilateral low back pain without sciatica  Patient request for diagnostic testing  Elevated blood pressure reading     Discharge Instructions     Take the prescribed ibuprofen as needed for your  pain.  Take the muscle relaxer Flexeril as needed for muscle spasm; do not drive, operate machinery, or drink alcohol with this medication as it may make you drowsy.    Follow up with an orthopedist if your pain is not improving.  Go to the emergency department if you have worsening pain or develop new symptoms such as difficulty with urination, weakness, numbness, loss of control of your bladder or bowels, fever, or chills.   Your blood pressure is elevated today at 137/95.  Please have this rechecked by your primary care provider in 2-4 weeks.    Your COVID test is pending.  You should self quarantine until your test result is back and is negative.    Go to the emergency department if you develop [place] fever, shortness of breath, severe diarrhea, or other concerning symptoms.       ED Prescriptions    Medication Sig Dispense Auth. Provider   ibuprofen (ADVIL) 800 MG tablet Take 1 tablet (800 mg total) by mouth every 8 (eight) hours as needed. 21 tablet Sharion Balloon, NP   cyclobenzaprine (FLEXERIL) 10 MG tablet Take 1 tablet (10 mg total) by mouth 2 (two) times daily as needed for muscle spasms. 20 tablet Sharion Balloon, NP     PDMP not reviewed this encounter.   Sharion Balloon, NP 11/01/19 1248

## 2019-11-03 LAB — NOVEL CORONAVIRUS, NAA: SARS-CoV-2, NAA: NOT DETECTED

## 2020-04-11 ENCOUNTER — Other Ambulatory Visit: Payer: Self-pay | Admitting: Sports Medicine

## 2020-04-11 DIAGNOSIS — M5412 Radiculopathy, cervical region: Secondary | ICD-10-CM

## 2020-04-17 ENCOUNTER — Ambulatory Visit
Admission: RE | Admit: 2020-04-17 | Discharge: 2020-04-17 | Disposition: A | Discharge status: Home / Self Care | Payer: 59 | Source: Ambulatory Visit | Attending: Sports Medicine | Admitting: Sports Medicine

## 2020-04-17 ENCOUNTER — Other Ambulatory Visit: Payer: Self-pay

## 2020-04-17 DIAGNOSIS — J069 Acute upper respiratory infection, unspecified: Secondary | ICD-10-CM | POA: Insufficient documentation

## 2020-04-17 DIAGNOSIS — M5412 Radiculopathy, cervical region: Secondary | ICD-10-CM

## 2020-04-17 DIAGNOSIS — L03119 Cellulitis of unspecified part of limb: Secondary | ICD-10-CM | POA: Insufficient documentation

## 2020-04-17 MED ORDER — IOPAMIDOL (ISOVUE-M 300) INJECTION 61%
1.0000 mL | Freq: Once | INTRAMUSCULAR | Status: AC
  Administered 2020-04-17: 1 mL via EPIDURAL

## 2020-04-17 MED ORDER — TRIAMCINOLONE ACETONIDE 40 MG/ML IJ SUSP (RADIOLOGY)
60.0000 mg | Freq: Once | INTRAMUSCULAR | Status: AC
  Administered 2020-04-17: 60 mg via EPIDURAL

## 2020-04-17 NOTE — Discharge Instr - Other Orders (Signed)

## 2020-04-17 NOTE — Discharge Instructions (Signed)

## 2020-10-09 ENCOUNTER — Encounter: Payer: Self-pay | Admitting: Internal Medicine

## 2020-10-09 ENCOUNTER — Ambulatory Visit (INDEPENDENT_AMBULATORY_CARE_PROVIDER_SITE_OTHER): Payer: 59 | Admitting: Internal Medicine

## 2020-10-09 ENCOUNTER — Other Ambulatory Visit: Payer: Self-pay

## 2020-10-09 ENCOUNTER — Other Ambulatory Visit: Payer: Self-pay | Admitting: Internal Medicine

## 2020-10-09 VITALS — BP 140/92 | HR 64 | Temp 98.2°F | Ht 75.0 in | Wt 285.0 lb

## 2020-10-09 DIAGNOSIS — E559 Vitamin D deficiency, unspecified: Secondary | ICD-10-CM

## 2020-10-09 DIAGNOSIS — Z125 Encounter for screening for malignant neoplasm of prostate: Secondary | ICD-10-CM | POA: Diagnosis not present

## 2020-10-09 DIAGNOSIS — Z23 Encounter for immunization: Secondary | ICD-10-CM | POA: Diagnosis not present

## 2020-10-09 DIAGNOSIS — Z0000 Encounter for general adult medical examination without abnormal findings: Secondary | ICD-10-CM | POA: Diagnosis not present

## 2020-10-09 DIAGNOSIS — Z1159 Encounter for screening for other viral diseases: Secondary | ICD-10-CM

## 2020-10-09 DIAGNOSIS — Z0001 Encounter for general adult medical examination with abnormal findings: Secondary | ICD-10-CM | POA: Insufficient documentation

## 2020-10-09 LAB — LIPID PANEL
Cholesterol: 143 mg/dL (ref 0–200)
HDL: 50.1 mg/dL (ref >39.00)
LDL Cholesterol: 77 mg/dL (ref 0–99)
NonHDL: 92.62
Total CHOL/HDL Ratio: 3
Triglycerides: 79 mg/dL (ref 0.0–149.0)
VLDL: 15.8 mg/dL (ref 0.0–40.0)

## 2020-10-09 LAB — BASIC METABOLIC PANEL
BUN: 13 mg/dL (ref 6–23)
CO2: 29 mEq/L (ref 19–32)
Calcium: 10 mg/dL (ref 8.4–10.5)
Chloride: 102 mEq/L (ref 96–112)
Creatinine, Ser: 1.03 mg/dL (ref 0.40–1.50)
GFR: 84.24 mL/min (ref >60.00)
Glucose, Bld: 86 mg/dL (ref 70–99)
Potassium: 4.4 mEq/L (ref 3.5–5.1)
Sodium: 137 mEq/L (ref 135–145)

## 2020-10-09 LAB — URINALYSIS, ROUTINE W REFLEX MICROSCOPIC
Bilirubin Urine: NEGATIVE
Hgb urine dipstick: NEGATIVE
Ketones, ur: NEGATIVE
Leukocytes,Ua: NEGATIVE
Nitrite: NEGATIVE
RBC / HPF: NONE SEEN (ref 0–?)
Specific Gravity, Urine: 1.025 (ref 1.000–1.030)
Total Protein, Urine: NEGATIVE
Urine Glucose: NEGATIVE
Urobilinogen, UA: 0.2 (ref 0.0–1.0)
pH: 5.5 (ref 5.0–8.0)

## 2020-10-09 LAB — CBC WITH DIFFERENTIAL/PLATELET
Basophils Absolute: 0.1 10*3/uL (ref 0.0–0.1)
Basophils Relative: 0.9 % (ref 0.0–3.0)
Eosinophils Absolute: 0.1 10*3/uL (ref 0.0–0.7)
Eosinophils Relative: 1.2 % (ref 0.0–5.0)
HCT: 48 % (ref 39.0–52.0)
Hemoglobin: 15.7 g/dL (ref 13.0–17.0)
Lymphocytes Relative: 36 % (ref 12.0–46.0)
Lymphs Abs: 2.3 10*3/uL (ref 0.7–4.0)
MCHC: 32.8 g/dL (ref 30.0–36.0)
MCV: 89.1 fl (ref 78.0–100.0)
Monocytes Absolute: 0.6 10*3/uL (ref 0.1–1.0)
Monocytes Relative: 8.8 % (ref 3.0–12.0)
Neutro Abs: 3.4 10*3/uL (ref 1.4–7.7)
Neutrophils Relative %: 53.1 % (ref 43.0–77.0)
Platelets: 197 10*3/uL (ref 150.0–400.0)
RBC: 5.38 Mil/uL (ref 4.22–5.81)
RDW: 14.6 % (ref 11.5–15.5)
WBC: 6.3 10*3/uL (ref 4.0–10.5)

## 2020-10-09 LAB — PSA: PSA: 0.91 ng/mL (ref 0.10–4.00)

## 2020-10-09 LAB — HEPATIC FUNCTION PANEL
ALT: 52 U/L (ref 0–53)
AST: 36 U/L (ref 0–37)
Albumin: 4.8 g/dL (ref 3.5–5.2)
Alkaline Phosphatase: 86 U/L (ref 39–117)
Bilirubin, Direct: 0.1 mg/dL (ref 0.0–0.3)
Total Bilirubin: 0.4 mg/dL (ref 0.2–1.2)
Total Protein: 7.5 g/dL (ref 6.0–8.3)

## 2020-10-09 LAB — TSH: TSH: 2.02 u[IU]/mL (ref 0.35–4.50)

## 2020-10-09 LAB — VITAMIN D 25 HYDROXY (VIT D DEFICIENCY, FRACTURES): VITD: 20.73 ng/mL — ABNORMAL LOW (ref 30.00–100.00)

## 2020-10-09 MED ORDER — VARDENAFIL HCL 20 MG PO TABS: 20.0000 mg | ORAL_TABLET | ORAL | 11 refills | Status: DC | PRN

## 2020-10-09 MED ORDER — ROSUVASTATIN CALCIUM 20 MG PO TABS: 20.0000 mg | ORAL_TABLET | Freq: Every day | ORAL | 3 refills | Status: DC

## 2020-10-09 MED ORDER — PANTOPRAZOLE SODIUM 20 MG PO TBEC: 20.0000 mg | DELAYED_RELEASE_TABLET | Freq: Every day | ORAL | 3 refills | Status: DC

## 2020-10-09 NOTE — Assessment & Plan Note (Signed)

## 2020-10-09 NOTE — Patient Instructions (Addendum)
Please remember to have your covid booster done after dec 1  - at [email or web address] or [email or web address]  You had the Tdap tetanus shot today  Please continue all other medications as before, and refills have been done if requested.  Please have the pharmacy call with any other refills you may need.  Please continue your efforts at being more active, low cholesterol diet, and weight control.  You are otherwise up to date with prevention measures today.  Please keep your appointments with your specialists as you may have planned  Please go to the LAB at the blood drawing area for the tests to be done  You will be contacted by phone if any changes need to be made immediately.  Otherwise, you will receive a letter about your results with an explanation, but please check with MyChart first.  Please remember to sign up for MyChart if you have not done so, as this will be important to you in the future with finding out test results, communicating by private email, and scheduling acute appointments online when needed.  Please make an Appointment to return for your 1 year visit, or sooner if needed, with Lab testing by Appointment as well, to be done about 3-5 days before at the [facility] (so this is for TWO appointments - please see the scheduling desk as you leave)

## 2020-10-09 NOTE — Assessment & Plan Note (Signed)
For oral replacement 

## 2020-10-09 NOTE — Progress Notes (Signed)
Subjective:    Patient ID: Lawrence Randolph, male    DOB: 10-Jan-1971, 49 y.o.   MRN: [id number]  HPI  Here for wellness and f/u;  Overall doing ok;  Pt denies Chest pain, worsening SOB, DOE, wheezing, orthopnea, PND, worsening LE edema, palpitations, dizziness or syncope.  Pt denies neurological change such as new headache, facial or extremity weakness.  Pt denies polydipsia, polyuria, or low sugar symptoms. Pt states overall good compliance with treatment and medications, good tolerability, and has been trying to follow appropriate diet.  Pt denies worsening depressive symptoms, suicidal ideation or panic. No fever, night sweats, wt loss, loss of appetite, or other constitutional symptoms.  Pt states good ability with ADL's, has low fall risk, home safety reviewed and adequate, no other significant changes in hearing or vision, and only occasionally active with exercise. No prior covid infection, had vaccination  No new complaints  Working as Dealer, still has intermittent hand cramps Past Medical History:  Diagnosis Date  . Cancer ([place]) 09/28/2005   hodgkins lymphoma  stage I Hodgkin's disease.  Marland Kitchen ERECTILE DYSFUNCTION 10/11/2007   Qualifier: Diagnosis of  By: Jenny Reichmann MD, [place] 06/08/2008   Qualifier: Diagnosis of  By: Jenny Reichmann MD, Hunt Oris   . GERD (gastroesophageal reflux disease)   . HYPERLIPIDEMIA 10/18/2007   Qualifier: Diagnosis of  By: Jenny Reichmann MD, Hunt Oris   . HYPOGONADISM, MALE 10/18/2007   Qualifier: Diagnosis of  By: Jenny Reichmann MD, Hunt Oris    Past Surgical History:  Procedure Laterality Date  . NECK SURGERY Right 04/2011   biopsy - stage I Hodgkin's disease.  . right VATS  04/2011   for organizing chronic inflammation RUL    reports that he quit smoking about 19 years ago. His smoking use included cigarettes. He has a 3.75 pack-year smoking history. He has never used smokeless tobacco. He reports that he does not drink alcohol and does not use drugs. family history includes  Diabetes in an other family member; Hypertension in an other family member; Prostate cancer in his father, paternal grandfather, and paternal uncle. No Known Allergies Current Outpatient Medications on File Prior to Visit  Medication Sig Dispense Refill  . cyclobenzaprine (FLEXERIL) 10 MG tablet Take 1 tablet (10 mg total) by mouth 2 (two) times daily as needed for muscle spasms. 20 tablet 0  . gabapentin (NEURONTIN) 300 MG capsule Take by mouth.    Marland Kitchen ibuprofen (ADVIL) 800 MG tablet Take 1 tablet (800 mg total) by mouth every 8 (eight) hours as needed. 21 tablet 0  . valACYclovir (VALTREX) 1000 MG tablet Take 1 tablet (1,000 mg total) by mouth 2 (two) times daily. 14 tablet 5   No current facility-administered medications on file prior to visit.   Review of Systems All otherwise neg per pt    Objective:   Physical Exam BP (!) 140/92 (BP Location: Left Arm, Patient Position: Sitting, Cuff Size: Large)   Pulse 64   Temp 98.2 F (36.8 C) (Oral)   Ht 6\' 3"  (1.905 m)   Wt 285 lb (129.3 kg)   SpO2 97%   BMI 35.62 kg/m  VS noted,  Constitutional: Pt appears in NAD HENT: Head: NCAT.  Right Ear: External ear normal.  Left Ear: External ear normal.  Eyes: . Pupils are equal, round, and reactive to light. Conjunctivae and EOM are normal Nose: without d/c or deformity Neck: Neck supple. Gross normal ROM Cardiovascular: Normal rate and regular rhythm.  Pulmonary/Chest: Effort normal and breath sounds without rales or wheezing.  Abd:  Soft, NT, ND, + BS, no organomegaly Neurological: Pt is alert. At baseline orientation, motor grossly intact Skin: Skin is warm. No rashes, other new lesions, no LE edema Psychiatric: Pt behavior is normal without agitation  All otherwise neg per pt Lab Results  Component Value Date   WBC 6.3 10/09/2020   HGB 15.7 10/09/2020   HCT 48.0 10/09/2020   PLT 197.0 10/09/2020   GLUCOSE 86 10/09/2020   CHOL 143 10/09/2020   TRIG 79.0 10/09/2020   HDL 50.10  10/09/2020   LDLDIRECT 168.0 09/09/2018   LDLCALC 77 10/09/2020   ALT 52 10/09/2020   AST 36 10/09/2020   NA 137 10/09/2020   K 4.4 10/09/2020   CL 102 10/09/2020   CREATININE 1.03 10/09/2020   BUN 13 10/09/2020   CO2 29 10/09/2020   TSH 2.02 10/09/2020   PSA 0.91 10/09/2020      Assessment & Plan:

## 2020-10-10 LAB — HEPATITIS C ANTIBODY
Hepatitis C Ab: NONREACTIVE
SIGNAL TO CUT-OFF: 0.01 (ref <1.00)

## 2020-11-06 ENCOUNTER — Encounter: Payer: Self-pay | Admitting: Internal Medicine

## 2020-11-06 ENCOUNTER — Ambulatory Visit: Payer: 59 | Admitting: Internal Medicine

## 2020-11-06 ENCOUNTER — Other Ambulatory Visit: Payer: Self-pay

## 2020-11-06 DIAGNOSIS — I10 Essential (primary) hypertension: Secondary | ICD-10-CM | POA: Diagnosis not present

## 2020-11-06 DIAGNOSIS — E782 Mixed hyperlipidemia: Secondary | ICD-10-CM

## 2020-11-06 DIAGNOSIS — E559 Vitamin D deficiency, unspecified: Secondary | ICD-10-CM

## 2020-11-06 MED ORDER — AMLODIPINE BESYLATE 5 MG PO TABS
5.0000 mg | ORAL_TABLET | Freq: Every day | ORAL | 3 refills | Status: DC
Start: 2020-11-06 — End: 2021-11-11

## 2020-11-06 NOTE — Patient Instructions (Signed)
Ok to increase the amlodipine to 5 mg per day  After the first 3 wks, please check your BP daily for 10 days, and let us know the average  If the average is more then 130/80, we may need to add HCT 12.5 mg per day  Please continue all other medications as before, and refills have been done if requested.  Please have the pharmacy call with any other refills you may need.  Please continue your efforts at being more active, low cholesterol diet, and weight control.  Please keep your appointments with your specialists as you may have planned

## 2020-11-06 NOTE — Progress Notes (Addendum)
Subjective:    Patient ID: Lawrence Randolph, male    DOB: 08/29/1971, 49 y.o.   MRN: [id number]  HPI  Here to f/u; overall doing ok,  Pt denies chest pain, increasing sob or doe, wheezing, orthopnea, PND, increased LE swelling, palpitations, dizziness or syncope.  Pt denies new neurological symptoms such as new headache, or facial or extremity weakness or numbness.  Pt denies polydipsia, polyuria, or low sugar episode.  Pt states overall good compliance with meds, mostly trying to follow appropriate diet, with wt overall stable,  but little exercise however. BP Readings from Last 3 Encounters:  11/06/20 (!) 130/94  10/09/20 (!) 140/92  04/17/20 (!) 138/93  Tolerating the vit d dupplement Past Medical History:  Diagnosis Date  . Cancer ([place]) 09/28/2005   hodgkins lymphoma  stage I Hodgkin's disease.  Marland Kitchen ERECTILE DYSFUNCTION 10/11/2007   Qualifier: Diagnosis of  By: Jenny Reichmann MD, [place] 06/08/2008   Qualifier: Diagnosis of  By: Jenny Reichmann MD, Hunt Oris   . GERD (gastroesophageal reflux disease)   . HYPERLIPIDEMIA 10/18/2007   Qualifier: Diagnosis of  By: Jenny Reichmann MD, Hunt Oris   . HYPOGONADISM, MALE 10/18/2007   Qualifier: Diagnosis of  By: Jenny Reichmann MD, Hunt Oris    Past Surgical History:  Procedure Laterality Date  . NECK SURGERY Right 04/2011   biopsy - stage I Hodgkin's disease.  . right VATS  04/2011   for organizing chronic inflammation RUL    reports that he quit smoking about 19 years ago. His smoking use included cigarettes. He has a 3.75 pack-year smoking history. He has never used smokeless tobacco. He reports that he does not drink alcohol and does not use drugs. family history includes Diabetes in an other family member; Hypertension in an other family member; Prostate cancer in his father, paternal grandfather, and paternal uncle. No Known Allergies Current Outpatient Medications on File Prior to Visit  Medication Sig Dispense Refill  . cyclobenzaprine (FLEXERIL) 10 MG tablet Take  1 tablet (10 mg total) by mouth 2 (two) times daily as needed for muscle spasms. 20 tablet 0  . gabapentin (NEURONTIN) 300 MG capsule Take by mouth.    Marland Kitchen ibuprofen (ADVIL) 800 MG tablet Take 1 tablet (800 mg total) by mouth every 8 (eight) hours as needed. 21 tablet 0  . pantoprazole (PROTONIX) 20 MG tablet Take 1 tablet (20 mg total) by mouth daily. 90 tablet 3  . rosuvastatin (CRESTOR) 20 MG tablet Take 1 tablet (20 mg total) by mouth daily. 90 tablet 3  . valACYclovir (VALTREX) 1000 MG tablet Take 1 tablet (1,000 mg total) by mouth 2 (two) times daily. 14 tablet 5  . vardenafil (LEVITRA) 20 MG tablet Take 1 tablet (20 mg total) by mouth as needed for erectile dysfunction. 10 tablet 11   No current facility-administered medications on file prior to visit.   Review of Systems All otherwise neg per pt    Objective:   Physical Exam BP (!) 130/94 (BP Location: Left Arm, Patient Position: Sitting, Cuff Size: Large)   Pulse 76   Temp 98.8 F (37.1 C) (Oral)   Ht 6\' 3"  (1.905 m)   Wt 284 lb (128.8 kg)   SpO2 95%   BMI 35.50 kg/m  VS noted,  Constitutional: Pt appears in NAD HENT: Head: NCAT.  Right Ear: External ear normal.  Left Ear: External ear normal.  Eyes: . Pupils are equal, round, and reactive to light. Conjunctivae and EOM are  normal Nose: without d/c or deformity Neck: Neck supple. Gross normal ROM Cardiovascular: Normal rate and regular rhythm.   Pulmonary/Chest: Effort normal and breath sounds without rales or wheezing.  Abd:  Soft, NT, ND, + BS, no organomegaly Neurological: Pt is alert. At baseline orientation, motor grossly intact Skin: Skin is warm. No rashes, other new lesions, no LE edema Psychiatric: Pt behavior is normal without agitation  All otherwise neg per pt Lab Results  Component Value Date   WBC 6.3 10/09/2020   HGB 15.7 10/09/2020   HCT 48.0 10/09/2020   PLT 197.0 10/09/2020   GLUCOSE 86 10/09/2020   CHOL 143 10/09/2020   TRIG 79.0 10/09/2020    HDL 50.10 10/09/2020   LDLDIRECT 168.0 09/09/2018   LDLCALC 77 10/09/2020   ALT 52 10/09/2020   AST 36 10/09/2020   NA 137 10/09/2020   K 4.4 10/09/2020   CL 102 10/09/2020   CREATININE 1.03 10/09/2020   BUN 13 10/09/2020   CO2 29 10/09/2020   TSH 2.02 10/09/2020   PSA 0.91 10/09/2020      Assessment & Plan:  amlod 5

## 2020-11-11 ENCOUNTER — Encounter: Payer: Self-pay | Admitting: Internal Medicine

## 2020-11-11 NOTE — Assessment & Plan Note (Signed)
Cont oral replacement 

## 2020-11-11 NOTE — Assessment & Plan Note (Signed)
stable overall by history and exam, recent data reviewed with pt, and pt to continue medical treatment as before,  to f/u any worsening symptoms or concerns  

## 2020-11-11 NOTE — Assessment & Plan Note (Addendum)
Uncontrolled, to increases the amlodipine 5 qd, call with BP and consider HCT 12.5 for bp > 130/90  I spent 31 minutes in preparing to see the patient by review of recent labs, imaging and procedures, obtaining and reviewing separately obtained history, communicating with the patient and family or caregiver, ordering medications, tests or procedures, and documenting clinical information in the EHR including the differential Dx, treatment, and any further evaluation and other management of htn, hld, vit d def

## 2020-11-20 ENCOUNTER — Encounter: Payer: Self-pay | Admitting: Podiatry

## 2020-11-20 ENCOUNTER — Other Ambulatory Visit: Payer: Self-pay

## 2020-11-20 ENCOUNTER — Ambulatory Visit: Payer: 59 | Admitting: Podiatry

## 2020-11-20 ENCOUNTER — Ambulatory Visit (INDEPENDENT_AMBULATORY_CARE_PROVIDER_SITE_OTHER): Payer: 59

## 2020-11-20 DIAGNOSIS — M722 Plantar fascial fibromatosis: Secondary | ICD-10-CM

## 2020-11-20 NOTE — Progress Notes (Signed)
Subjective:  Patient ID: Lawrence Randolph, male    DOB: 1971-01-15,  MRN: [id number]  Chief Complaint  Patient presents with  . Foot Pain    Patient presents today for left heel pain x 2 weeks    49 y.o. male presents with the above complaint.  Patient presents with complaint of left heel pain that has been there for 2 weeks.  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  Patient states it came out of nowhere.  He has not tried anything for it.  He wants to get it treated.  He has not seen anyone else prior to seeing me.  Pain scale 7 out of 10 is sharp shooting in nature   Review of Systems: Negative except as noted in the HPI. Denies N/V/F/Ch.  Past Medical History:  Diagnosis Date  . Cancer ([place]) 09/28/2005   hodgkins lymphoma  stage I Hodgkin's disease.  Marland Kitchen ERECTILE DYSFUNCTION 10/11/2007   Qualifier: Diagnosis of  By: Jenny Reichmann MD, [place] 06/08/2008   Qualifier: Diagnosis of  By: Jenny Reichmann MD, [facility] Oris   . GERD (gastroesophageal reflux disease)   . HYPERLIPIDEMIA 10/18/2007   Qualifier: Diagnosis of  By: Jenny Reichmann MD, [facility] Oris   . HYPOGONADISM, MALE 10/18/2007   Qualifier: Diagnosis of  By: Jenny Reichmann MD, Hunt Oris     Current Outpatient Medications:  .  amLODipine (NORVASC) 5 MG tablet, Take 1 tablet (5 mg total) by mouth daily., Disp: 90 tablet, Rfl: 3 .  pantoprazole (PROTONIX) 20 MG tablet, Take 1 tablet (20 mg total) by mouth daily., Disp: 90 tablet, Rfl: 3 .  rosuvastatin (CRESTOR) 20 MG tablet, Take 1 tablet (20 mg total) by mouth daily., Disp: 90 tablet, Rfl: 3 .  valACYclovir (VALTREX) 1000 MG tablet, Take 1 tablet (1,000 mg total) by mouth 2 (two) times daily., Disp: 14 tablet, Rfl: 5 .  vardenafil (LEVITRA) 20 MG tablet, Take 1 tablet (20 mg total) by mouth as needed for erectile dysfunction., Disp: 10 tablet, Rfl: 11  Social History   Tobacco Use  Smoking Status Former Smoker  . Packs/day: 0.25  . Years: 15.00  . Pack years: 3.75  .  Types: Cigarettes  . Quit date: 02/12/2001  . Years since quitting: 19.7  Smokeless Tobacco Never Used    No Known Allergies Objective:  There were no vitals filed for this visit. There is no height or weight on file to calculate BMI. Constitutional Well developed. Well nourished.  Vascular Dorsalis pedis pulses palpable bilaterally. Posterior tibial pulses palpable bilaterally. Capillary refill normal to all digits.  No cyanosis or clubbing noted. Pedal hair growth normal.  Neurologic Normal speech. Oriented to person, place, and time. Epicritic sensation to light touch grossly present bilaterally.  Dermatologic Nails well groomed and normal in appearance. No open wounds. No skin lesions.  Orthopedic: Normal joint ROM without pain or crepitus bilaterally. No visible deformities. Tender to palpation at the calcaneal tuber left. No pain with calcaneal squeeze left. Ankle ROM full range of motion left. Silfverskiold Test: negative left.   Radiographs: Taken and reviewed. No acute fractures or dislocations. No evidence of stress fracture.  Plantar heel spur absent. Posterior heel spur absent.   Assessment:   1. Plantar fasciitis of left foot    Plan:  Patient was evaluated and treated and all questions answered.  Plantar Fasciitis, left - XR reviewed as above.  - Educated on icing and  stretching. Instructions given.  - Injection delivered to the plantar fascia as below. - DME: Plantar Fascial Brace - Pharmacologic management: None  Procedure: Injection Tendon/Ligament Location: Left plantar fascia at the glabrous junction; medial approach. Skin Prep: alcohol Injectate: 0.5 cc 0.5% marcaine plain, 0.5 cc of 1% Lidocaine, 0.5 cc kenalog 10. Disposition: Patient tolerated procedure well. Injection site dressed with a band-aid.  No follow-ups on file.

## 2020-11-27 ENCOUNTER — Ambulatory Visit: Payer: 59 | Admitting: Internal Medicine

## 2020-11-27 ENCOUNTER — Encounter: Payer: Self-pay | Admitting: Internal Medicine

## 2020-11-27 ENCOUNTER — Other Ambulatory Visit: Payer: Self-pay

## 2020-11-27 DIAGNOSIS — I10 Essential (primary) hypertension: Secondary | ICD-10-CM

## 2020-11-27 DIAGNOSIS — E782 Mixed hyperlipidemia: Secondary | ICD-10-CM

## 2020-11-27 DIAGNOSIS — E559 Vitamin D deficiency, unspecified: Secondary | ICD-10-CM | POA: Diagnosis not present

## 2020-11-27 DIAGNOSIS — J309 Allergic rhinitis, unspecified: Secondary | ICD-10-CM | POA: Diagnosis not present

## 2020-11-27 NOTE — Progress Notes (Deleted)
   Subjective:    Patient ID: Lawrence Randolph, male    DOB: 22-Oct-1971, 49 y.o.   MRN: [id number]  HPI  BP Readings from Last 3 Encounters:  11/27/20 130/80  11/06/20 (!) 130/94  10/09/20 (!) 140/92     Review of Systems     Objective:   Physical Exam BP 130/80 (BP Location: Left Arm, Patient Position: Sitting, Cuff Size: Large)   Pulse 70   Temp 98.2 F (36.8 C) (Oral)   Ht 6\' 3"  (1.905 m)   Wt 283 lb (128.4 kg)   SpO2 98%   BMI 35.37 kg/m        Assessment & Plan:

## 2020-11-27 NOTE — Patient Instructions (Signed)
Please take all new medication as prescribed - the otc allegra and nasacort for allergies  Please continue all other medications as before, and refills have been done if requested.  Please have the pharmacy call with any other refills you may need.  Please continue your efforts at being more active, low cholesterol diet, and weight control..  Please keep your appointments with your specialists as you may have planned  Please make an Appointment to return in 6 months, or sooner if needed

## 2020-11-30 ENCOUNTER — Telehealth: Payer: Self-pay | Admitting: *Deleted

## 2020-11-30 NOTE — Telephone Encounter (Signed)
"  I was seen last week at your office about my left foot.  The nurse said she didn't have a large ankle brace to fit my foot at the time.  She said you were getting a shipment.  She called me on Friday and said they are in.  She told me to give you guys a call to set up an appointment.  I can come in on Wednesday morning.  Give me a call."

## 2020-12-05 NOTE — Telephone Encounter (Signed)
I attempted to call the patient.  I left him a message that the brace (Plantar Fascial Brace) is here.  I informed him that he does not need an appointment.  He can stop by the office and get fitted for the brace without an appointment on tomorrow.  I asked him to call if he has any questions.

## 2020-12-13 ENCOUNTER — Encounter: Payer: Self-pay | Admitting: Internal Medicine

## 2020-12-13 DIAGNOSIS — J309 Allergic rhinitis, unspecified: Secondary | ICD-10-CM | POA: Insufficient documentation

## 2020-12-13 NOTE — Progress Notes (Signed)
Established Patient Office Visit  Subjective:  Patient ID: Lawrence Randolph, male    DOB: 1970-12-31  Age: 49 y.o. MRN: [id number]      Chief Complaint: (concise statement describing the symptom, problem, condition, diagnosis, physician recommended return, or other factor as reason for encounter) follow up HTN, HLD and, allergies and low vit d       HPI:  REGNALD Randolph is a 49 y.o. male here to f/u; overall doing ok,  Pt denies chest pain, increasing sob or doe, wheezing, orthopnea, PND, increased LE swelling, palpitations, dizziness or syncope.  Pt denies new neurological symptoms such as new headache, or facial or extremity weakness or numbness.  Pt denies polydipsia, polyuria, or symptomatic low sugars. Pt states overall good compliance with meds, mostly trying to follow appropriate diet, with wt overall stable,  but little exercise however. Does have several wks ongoing nasal allergy symptoms with clearish congestion, itch and sneezing, without fever, pain, ST, cough, swelling or wheezing. .        Wt Readings from Last 3 Encounters:  11/27/20 283 lb (128.4 kg)  11/06/20 284 lb (128.8 kg)  10/09/20 285 lb (129.3 kg)   BP Readings from Last 3 Encounters:  11/27/20 130/80  11/06/20 (!) 130/94  10/09/20 (!) 140/92         Past Medical History:  Diagnosis Date  . Cancer ([place]) 09/28/2005   hodgkins lymphoma  stage I Hodgkin's disease.  Marland Kitchen ERECTILE DYSFUNCTION 10/11/2007   Qualifier: Diagnosis of  By: Jenny Reichmann MD, [place] 06/08/2008   Qualifier: Diagnosis of  By: Jenny Reichmann MD, [facility] Oris   . GERD (gastroesophageal reflux disease)   . HYPERLIPIDEMIA 10/18/2007   Qualifier: Diagnosis of  By: Jenny Reichmann MD, [place] Oris   . HYPOGONADISM, MALE 10/18/2007   Qualifier: Diagnosis of  By: Jenny Reichmann MD, [facility] Oris    Past Surgical History:  Procedure Laterality Date  . NECK SURGERY Right 04/2011   biopsy - stage I Hodgkin's disease.  . right VATS  04/2011   for organizing chronic inflammation RUL     reports that he has been smoking cigarettes. He has a 3.75 pack-year smoking history. He has never used smokeless tobacco. He reports that he does not drink alcohol and does not use drugs. family history includes Diabetes in an other family member; Hypertension in an other family member; Prostate cancer in his father, paternal grandfather, and paternal uncle. No Known Allergies Current Outpatient Medications on File Prior to Visit  Medication Sig Dispense Refill  . amLODipine (NORVASC) 5 MG tablet Take 1 tablet (5 mg total) by mouth daily. 90 tablet 3  . pantoprazole (PROTONIX) 20 MG tablet Take 1 tablet (20 mg total) by mouth daily. 90 tablet 3  . rosuvastatin (CRESTOR) 20 MG tablet Take 1 tablet (20 mg total) by mouth daily. 90 tablet 3  . valACYclovir (VALTREX) 1000 MG tablet Take 1 tablet (1,000 mg total) by mouth 2 (two) times daily. 14 tablet 5  . vardenafil (LEVITRA) 20 MG tablet Take 1 tablet (20 mg total) by mouth as needed for erectile dysfunction. 10 tablet 11   No current facility-administered medications on file prior to visit.        ROS:  All others reviewed and negative.  Objective        PE:  BP 130/80 (BP Location: Left Arm, Patient Position: Sitting, Cuff Size: Large)   Pulse 70   Temp 98.2 F (36.8 C) (Oral)  Ht 6\' 3"  (1.905 m)   Wt 283 lb (128.4 kg)   SpO2 98%   BMI 35.37 kg/m                 Constitutional: Pt appears in NAD               HENT: Head: NCAT.                Right Ear: External ear normal.                 Left Ear: External ear normal.                Eyes: . Pupils are equal, round, and reactive to light. Conjunctivae and EOM are normal               Nose: without d/c or deformity               Neck: Neck supple. Gross normal ROM               Cardiovascular: Normal rate and regular rhythm.                 Pulmonary/Chest: Effort normal and breath sounds without rales or wheezing.                Abd:  Soft, NT, ND, + BS, no organomegaly                Neurological: Pt is alert. At baseline orientation, motor grossly intact               Skin: Skin is warm. No rashes, no other new lesions, LE edema - none               Psychiatric: Pt behavior is normal without agitation   Assessment/Plan:  Lawrence Randolph is a 49 y.o. Black or African American [2] male with  has a past medical history of Cancer ([place]) (09/28/2005), ERECTILE DYSFUNCTION (10/11/2007), GENITAL HERPES (06/08/2008), GERD (gastroesophageal reflux disease), HYPERLIPIDEMIA (10/18/2007), and HYPOGONADISM, MALE (10/18/2007).   Assessment Plan  See problem oriented assessment and plan Labs reviewed for each problem: Lab Results  Component Value Date   WBC 6.3 10/09/2020   HGB 15.7 10/09/2020   HCT 48.0 10/09/2020   PLT 197.0 10/09/2020   GLUCOSE 86 10/09/2020   CHOL 143 10/09/2020   TRIG 79.0 10/09/2020   HDL 50.10 10/09/2020   LDLDIRECT 168.0 09/09/2018   LDLCALC 77 10/09/2020   ALT 52 10/09/2020   AST 36 10/09/2020   NA 137 10/09/2020   K 4.4 10/09/2020   CL 102 10/09/2020   CREATININE 1.03 10/09/2020   BUN 13 10/09/2020   CO2 29 10/09/2020   TSH 2.02 10/09/2020   PSA 0.91 10/09/2020    Micro: none  Cardiac tracings I have personally interpreted today:  none  Pertinent Radiological findings (summarize): none   I spent total 34 minutes in caring for the patient for this visit:  1) by communicating with the patient during the visit  2) by review of pertinent vital sign data, physical examination and labs as documented in the assessment and plan  3) by review of pertinent imaging - none today  4) by review of pertinent procedures - none today  5) by obtaining and reviewing separately obtained information from family/caretaker and Care Everywhere - none today  6) by ordering medications  7) by ordering tests  8) by documenting all of this clinical  information in the EHR including the management of each problem noted today in assessment and  plan   Health Maintenance Due  Topic Date Due  . COVID-19 Vaccine (3 - Pfizer risk 4-dose series) 06/12/2020    There are no preventive care reminders to display for this patient.   Problem List Items Addressed This Visit      Medium   Vitamin D deficiency    Last vitamin D Lab Results  Component Value Date   VD25OH 20.73 (L) 10/09/2020  for vit d3 2000 u qd      HYPERLIPIDEMIA    Lab Results  Component Value Date   LDLCALC 77 10/09/2020  stable overall by history and exam, recent data reviewed with pt, and pt to continue medical treatment as before,  to f/u any worsening symptoms or concerns       HTN (hypertension)    BP Readings from Last 3 Encounters:  11/27/20 130/80  11/06/20 (!) 130/94  10/09/20 (!) 140/92  stable overall by history and exam, recent data reviewed with pt, and pt to continue medical treatment as before,  to f/u any worsening symptoms or concerns       Allergic rhinitis    Mild to mod, for allegra and nasacort otc prn,  to f/u any worsening symptoms or concerns         No orders of the defined types were placed in this encounter.   Follow-up: Return in about 6 months (around 05/28/2021).    Oliver Barre, MD 12/13/2020 8:33 PM [facility]

## 2020-12-13 NOTE — Assessment & Plan Note (Signed)
Last vitamin D Lab Results  Component Value Date   VD25OH 20.73 (L) 10/09/2020  for vit d3 2000 u qd

## 2020-12-13 NOTE — Assessment & Plan Note (Signed)
Mild to mod, for allegra and nasacort otc prn,  to f/u any worsening symptoms or concerns

## 2020-12-13 NOTE — Assessment & Plan Note (Signed)
BP Readings from Last 3 Encounters:  11/27/20 130/80  11/06/20 (!) 130/94  10/09/20 (!) 140/92  stable overall by history and exam, recent data reviewed with pt, and pt to continue medical treatment as before,  to f/u any worsening symptoms or concerns

## 2020-12-13 NOTE — Assessment & Plan Note (Signed)
Lab Results  Component Value Date   LDLCALC 77 10/09/2020  stable overall by history and exam, recent data reviewed with pt, and pt to continue medical treatment as before,  to f/u any worsening symptoms or concerns

## 2020-12-18 ENCOUNTER — Ambulatory Visit: Payer: 59 | Admitting: Podiatry

## 2021-01-08 ENCOUNTER — Other Ambulatory Visit: Payer: Self-pay

## 2021-01-08 ENCOUNTER — Encounter: Payer: Self-pay | Admitting: Podiatry

## 2021-01-08 ENCOUNTER — Ambulatory Visit: Payer: 59 | Admitting: Podiatry

## 2021-01-08 ENCOUNTER — Encounter: Payer: Self-pay | Admitting: *Deleted

## 2021-01-08 DIAGNOSIS — Q666 Other congenital valgus deformities of feet: Secondary | ICD-10-CM

## 2021-01-08 DIAGNOSIS — M722 Plantar fascial fibromatosis: Secondary | ICD-10-CM | POA: Diagnosis not present

## 2021-01-08 MED ORDER — METHYLPREDNISOLONE 4 MG PO TABS
ORAL_TABLET | ORAL | 0 refills | Status: DC
Start: 2021-01-08 — End: 2021-11-25

## 2021-01-08 NOTE — Progress Notes (Signed)
Subjective:  Patient ID: Lawrence Randolph, male    DOB: January 01, 1971,  MRN: [id number]  Chief Complaint  Patient presents with  . Plantar Fasciitis    "its really no better, its really painful when I work and first getting out of bed.  The brace has helped some"    50 y.o. male presents with the above complaint.  Patient presents with follow-up of left plantar fasciitis.  Patient states the pain is about the same.  The injection did not help.  The brace did help.  He has tried stretching as well.  He denies any other acute complaints is still very painful to work.  He is constantly on his foot was still toe boots.   Review of Systems: Negative except as noted in the HPI. Denies N/V/F/Ch.  Past Medical History:  Diagnosis Date  . Cancer ([place]) 09/28/2005   hodgkins lymphoma  stage I Hodgkin's disease.  Marland Kitchen ERECTILE DYSFUNCTION 10/11/2007   Qualifier: Diagnosis of  By: Jenny Reichmann MD, [place] 06/08/2008   Qualifier: Diagnosis of  By: Jenny Reichmann MD, [facility] Oris   . GERD (gastroesophageal reflux disease)   . HYPERLIPIDEMIA 10/18/2007   Qualifier: Diagnosis of  By: Jenny Reichmann MD, [place] Oris   . HYPOGONADISM, MALE 10/18/2007   Qualifier: Diagnosis of  By: Jenny Reichmann MD, Hunt Oris     Current Outpatient Medications:  .  methylPREDNISolone (MEDROL) 4 MG tablet, Take as directed, Disp: 21 tablet, Rfl: 0 .  amLODipine (NORVASC) 5 MG tablet, Take 1 tablet (5 mg total) by mouth daily., Disp: 90 tablet, Rfl: 3 .  pantoprazole (PROTONIX) 20 MG tablet, Take 1 tablet (20 mg total) by mouth daily., Disp: 90 tablet, Rfl: 3 .  rosuvastatin (CRESTOR) 20 MG tablet, Take 1 tablet (20 mg total) by mouth daily., Disp: 90 tablet, Rfl: 3 .  valACYclovir (VALTREX) 1000 MG tablet, Take 1 tablet (1,000 mg total) by mouth 2 (two) times daily., Disp: 14 tablet, Rfl: 5 .  vardenafil (LEVITRA) 20 MG tablet, Take 1 tablet (20 mg total) by mouth as needed for erectile dysfunction., Disp: 10 tablet, Rfl: 11  Social History   Tobacco Use   Smoking Status Current Every Day Smoker  . Packs/day: 0.25  . Years: 15.00  . Pack years: 3.75  . Types: Cigarettes  Smokeless Tobacco Never Used    No Known Allergies Objective:  There were no vitals filed for this visit. There is no height or weight on file to calculate BMI. Constitutional Well developed. Well nourished.  Vascular Dorsalis pedis pulses palpable bilaterally. Posterior tibial pulses palpable bilaterally. Capillary refill normal to all digits.  No cyanosis or clubbing noted. Pedal hair growth normal.  Neurologic Normal speech. Oriented to person, place, and time. Epicritic sensation to light touch grossly present bilaterally.  Dermatologic Nails well groomed and normal in appearance. No open wounds. No skin lesions.  Orthopedic: Normal joint ROM without pain or crepitus bilaterally. No visible deformities. Tender to palpation at the calcaneal tuber left. No pain with calcaneal squeeze left. Ankle ROM full range of motion left. Silfverskiold Test: negative left.   Radiographs: Taken and reviewed. No acute fractures or dislocations. No evidence of stress fracture.  Plantar heel spur absent. Posterior heel spur absent.   Assessment:   1. Plantar fasciitis of left foot   2. Pes planovalgus    Plan:  Patient was evaluated and treated and all questions answered.  Plantar Fasciitis, left - XR reviewed as above.  -  Educated on icing and stretching. Instructions given.  -I will hold off on any further injection as it has not helped previously. - DME: Cam boot and night splint - Pharmacologic management: None -If there is no improvement we will discuss surgical options at this time.  Patient agrees with the plan.  Pes planovalgus -I explained to the patient the etiology of pes planovalgus and various treatment options were discussed.  Given that patient is experiencing plantar fasciitis in the setting of pes planovalgus I believe he will benefit from  custom-made orthotics to help control the hindfoot motion support the arch of the foot and take the stress off of the fascia.  Patient agrees with the plan. -She will be scheduled see rec for custom-made orthotics    No follow-ups on file.

## 2021-01-10 ENCOUNTER — Telehealth: Payer: Self-pay | Admitting: Podiatry

## 2021-01-10 NOTE — Telephone Encounter (Signed)
Pt called with questions about our orthotics that he is scheduled to be fitted for 2.14.He stated he had a friend that recommended the [facility] and I told him to stay away. He said he went and they showed him there orthotics and quoted him 1400.00. I explained that there orthotics are just a hard plastic as to where ours have cushioning and are custom to his foot. And our price is 438.00 and we will bill insurance.He is keeping his appt with Liliane Channel and said thank you for being honest.

## 2021-01-11 NOTE — Telephone Encounter (Signed)
That is perfect. I say the same thing

## 2021-01-21 ENCOUNTER — Other Ambulatory Visit: Payer: Self-pay

## 2021-01-21 ENCOUNTER — Ambulatory Visit (INDEPENDENT_AMBULATORY_CARE_PROVIDER_SITE_OTHER): Payer: 59 | Admitting: Orthotics

## 2021-01-21 ENCOUNTER — Encounter: Payer: Self-pay | Admitting: Podiatry

## 2021-01-21 DIAGNOSIS — M722 Plantar fascial fibromatosis: Secondary | ICD-10-CM

## 2021-01-21 DIAGNOSIS — Q666 Other congenital valgus deformities of feet: Secondary | ICD-10-CM

## 2021-01-21 NOTE — Progress Notes (Signed)

## 2021-01-28 ENCOUNTER — Other Ambulatory Visit: Payer: 59 | Admitting: Orthotics

## 2021-01-31 ENCOUNTER — Encounter: Payer: Self-pay | Admitting: Podiatry

## 2021-01-31 ENCOUNTER — Other Ambulatory Visit: Payer: Self-pay

## 2021-01-31 ENCOUNTER — Ambulatory Visit: Payer: 59 | Admitting: Orthotics

## 2021-01-31 DIAGNOSIS — M722 Plantar fascial fibromatosis: Secondary | ICD-10-CM

## 2021-01-31 NOTE — Progress Notes (Signed)
Patient picked up f/o and was pleased with fit, comfort, and function.  Worked well with footwear.  Told of rbeak in period and how to report any issues.  

## 2021-02-12 ENCOUNTER — Ambulatory Visit: Payer: 59 | Admitting: Podiatry

## 2021-02-21 ENCOUNTER — Ambulatory Visit: Payer: 59 | Admitting: Podiatry

## 2021-02-21 ENCOUNTER — Other Ambulatory Visit: Payer: Self-pay

## 2021-02-21 ENCOUNTER — Encounter: Payer: Self-pay | Admitting: Podiatry

## 2021-02-21 DIAGNOSIS — Z01818 Encounter for other preprocedural examination: Secondary | ICD-10-CM | POA: Diagnosis not present

## 2021-02-21 DIAGNOSIS — M722 Plantar fascial fibromatosis: Secondary | ICD-10-CM

## 2021-02-21 DIAGNOSIS — M21862 Other specified acquired deformities of left lower leg: Secondary | ICD-10-CM

## 2021-02-21 DIAGNOSIS — M216X2 Other acquired deformities of left foot: Secondary | ICD-10-CM

## 2021-02-21 NOTE — Progress Notes (Signed)
Subjective:  Patient ID: Lawrence Randolph, male    DOB: 12/31/1970,  MRN: [id number]  Chief Complaint  Patient presents with  . Plantar Fasciitis    "its no better.  Still very painful when I get up out of bed"    50 y.o. male presents with the above complaint.  Patient presents with follow-up of left plantar fasciitis.  Patient states the pain is same the injection did not help.  The brace does help a little bit.  He has been wearing the cam boot which does help in the cam boot however not when he comes out of the boot and transition.  He had obtained orthotics and has been functioning well with them he does like the orthotics however the pain in the left heel has not gone down.  He denies any other acute complaints.  He would like to discuss treatment options.  He is considering surgery as well.   Review of Systems: Negative except as noted in the HPI. Denies N/V/F/Ch.  Past Medical History:  Diagnosis Date  . Cancer ([place]) 09/28/2005   hodgkins lymphoma  stage I Hodgkin's disease.  Marland Kitchen ERECTILE DYSFUNCTION 10/11/2007   Qualifier: Diagnosis of  By: Jenny Reichmann MD, [place] 06/08/2008   Qualifier: Diagnosis of  By: Jenny Reichmann MD, [place] Oris   . GERD (gastroesophageal reflux disease)   . HYPERLIPIDEMIA 10/18/2007   Qualifier: Diagnosis of  By: Jenny Reichmann MD, [place] Oris   . HYPOGONADISM, MALE 10/18/2007   Qualifier: Diagnosis of  By: Jenny Reichmann MD, Hunt Oris     Current Outpatient Medications:  .  amLODipine (NORVASC) 5 MG tablet, Take 1 tablet (5 mg total) by mouth daily., Disp: 90 tablet, Rfl: 3 .  methylPREDNISolone (MEDROL) 4 MG tablet, Take as directed, Disp: 21 tablet, Rfl: 0 .  pantoprazole (PROTONIX) 20 MG tablet, Take 1 tablet (20 mg total) by mouth daily., Disp: 90 tablet, Rfl: 3 .  rosuvastatin (CRESTOR) 20 MG tablet, Take 1 tablet (20 mg total) by mouth daily., Disp: 90 tablet, Rfl: 3 .  valACYclovir (VALTREX) 1000 MG tablet, Take 1 tablet (1,000 mg total) by mouth 2 (two) times daily., Disp: 14  tablet, Rfl: 5 .  vardenafil (LEVITRA) 20 MG tablet, Take 1 tablet (20 mg total) by mouth as needed for erectile dysfunction., Disp: 10 tablet, Rfl: 11  Social History   Tobacco Use  Smoking Status Current Every Day Smoker  . Packs/day: 0.25  . Years: 15.00  . Pack years: 3.75  . Types: Cigarettes  Smokeless Tobacco Never Used    No Known Allergies Objective:  There were no vitals filed for this visit. There is no height or weight on file to calculate BMI. Constitutional Well developed. Well nourished.  Vascular Dorsalis pedis pulses palpable bilaterally. Posterior tibial pulses palpable bilaterally. Capillary refill normal to all digits.  No cyanosis or clubbing noted. Pedal hair growth normal.  Neurologic Normal speech. Oriented to person, place, and time. Epicritic sensation to light touch grossly present bilaterally.  Dermatologic Nails well groomed and normal in appearance. No open wounds. No skin lesions.  Orthopedic: Normal joint ROM without pain or crepitus bilaterally. No visible deformities. Tender to palpation at the calcaneal tuber left. No pain with calcaneal squeeze left. Ankle ROM full range of motion left. Silfverskiold Test: Positive with gastrocnemius recession   Radiographs: Taken and reviewed. No acute fractures or dislocations. No evidence of stress fracture.  Plantar heel spur absent. Posterior heel spur absent.  Assessment:   1. Gastrocnemius equinus, left   2. Plantar fasciitis of left foot   3. Preoperative examination    Plan:  Patient was evaluated and treated and all questions answered.  Plantar Fasciitis, left with underlying gastrocnemius equinus -I explained to the patient the etiology of plantar fasciitis and various treatment options were discussed.  Given that patient has failed all conservative treatment options including steroid injection cam boot and night splint I believe patient is surgical candidate for surgical intervention.   I discussed with the patient my surgical plan as well as my surgical goals in extensive detail.  He will be weightbearing as tolerated cam boot after the board to surgery.  I plan on performing left foot endoscopic plantar fasciotomy with gastrocnemius recession.  I discussed my surgical planning and my incision site as well as postop protocol in extensive detail.  He states understanding would like to proceed with the surgery. -Informed surgical risk consent was reviewed and read aloud to the patient.  I reviewed the films.  I have discussed my findings with the patient in great detail.  I have discussed all risks including but not limited to infection, stiffness, scarring, limp, disability, deformity, damage to blood vessels and nerves, numbness, poor healing, need for braces, arthritis, chronic pain, amputation, death.  All benefits and realistic expectations discussed in great detail.  I have made no promises as to the outcome.  I have provided realistic expectations.  I have offered the patient a 2nd opinion, which they have declined and assured me they preferred to proceed despite the risks -A total of 33 minutes was spent in direct patient care as well as pre and post patient encounter activities.  This includes documentation as well as reviewing patient chart for labs, imaging, past medical, surgical, social, and family history as documented in the EMR.  I have reviewed medication allergies as documented in EMR.  I discussed the etiology of condition and treatment options from conservative to surgical care.  All risks and benefit of the treatment course was discussed in detail.  All questions were answered and return appointment was discussed.  Since the visit completed in an ambulatory/outpatient setting, the patient and/or parent/guardian has been advised to contact the providers office for worsening condition and seek medical treatment and/or call 911 if the patient deems either is necessary.   Pes  planovalgus -I explained to the patient the etiology of pes planovalgus and various treatment options were discussed.  Given that patient is experiencing plantar fasciitis in the setting of pes planovalgus I believe he will benefit from custom-made orthotics to help control the hindfoot motion support the arch of the foot and take the stress off of the fascia.  Patient agrees with the plan. -Patient has obtained orthotics and is functioning well without acute problems.    No follow-ups on file.

## 2021-03-04 ENCOUNTER — Telehealth: Payer: Self-pay | Admitting: Urology

## 2021-03-04 NOTE — Telephone Encounter (Signed)
DOS: 03/18/21  EPF LEFT --- 68032 GASTROENEMIUS RECESS LEFT --- 12248  [facility] EFFECTIVE DATE-  12/15/20  PLAN DEDUCTIBLE -   $500.00 W/  $201.84  REMAINING OUT OF POCKET -  $3,000.00 W/  $2,500.37 REMAINING COPAY - $0.00 COINSURANCE - 20%    PER [facility] SITE CPT CODES 04888 AND [id number] HAS BEEN [place], AUTH #[id number]

## 2021-03-18 ENCOUNTER — Telehealth: Payer: Self-pay | Admitting: Podiatry

## 2021-03-18 ENCOUNTER — Other Ambulatory Visit: Payer: Self-pay | Admitting: Podiatry

## 2021-03-18 DIAGNOSIS — M722 Plantar fascial fibromatosis: Secondary | ICD-10-CM | POA: Diagnosis not present

## 2021-03-18 DIAGNOSIS — M216X2 Other acquired deformities of left foot: Secondary | ICD-10-CM | POA: Diagnosis not present

## 2021-03-18 MED ORDER — OXYCODONE-ACETAMINOPHEN 5-325 MG PO TABS: 1.0000 | ORAL_TABLET | Freq: Four times a day (QID) | ORAL | 0 refills | Status: DC | PRN

## 2021-03-18 MED ORDER — OXYCODONE-ACETAMINOPHEN 5-325 MG PO TABS: 1.0000 | ORAL_TABLET | ORAL | 0 refills | Status: DC | PRN

## 2021-03-18 MED ORDER — IBUPROFEN 800 MG PO TABS: 800.0000 mg | ORAL_TABLET | Freq: Four times a day (QID) | ORAL | 1 refills | Status: DC | PRN

## 2021-03-18 MED ORDER — OXYCODONE-ACETAMINOPHEN 5-325 MG PO TABS: 2.0000 | ORAL_TABLET | ORAL | 0 refills | Status: DC | PRN

## 2021-03-18 NOTE — Telephone Encounter (Signed)
Pharmacy is requesting that frequency to reduced to one every 4 hours or 1 every 6 hours for this initial prescription.

## 2021-03-18 NOTE — Telephone Encounter (Signed)
Pharmacy called and stated that it still says 2 every four hours and it needs to say one every 4-6 hours.

## 2021-03-18 NOTE — Telephone Encounter (Signed)
done

## 2021-03-22 ENCOUNTER — Telehealth: Payer: Self-pay | Admitting: Podiatry

## 2021-03-22 MED ORDER — HYDROCODONE-ACETAMINOPHEN 5-325 MG PO TABS: 1.0000 | ORAL_TABLET | Freq: Four times a day (QID) | ORAL | 0 refills | Status: DC | PRN

## 2021-03-22 NOTE — Telephone Encounter (Signed)
Pt presented to the office and stated that the pain medication is causing sharp stabbing pains in his stomach. He states that he stopped taking the medication and would ike to know if he could possibly have something different sent in to [place], [address]. Please advise

## 2021-03-22 NOTE — Telephone Encounter (Signed)
Done I sent a different medication in.  See if that helps

## 2021-03-22 NOTE — Telephone Encounter (Signed)
Pt stated that he will try the new medication and will try to eat more this time so that he is not taking it on an empty stomach. I advised the pt to call or send a message to [place] through mychart if this problem occurred again.

## 2021-03-22 NOTE — Addendum Note (Signed)
Addended by: Boneta Lucks on: 03/22/2021 10:34 AM   Modules accepted: Orders

## 2021-03-22 NOTE — Telephone Encounter (Signed)
Patient called and that the medication that was prescribed for him after his sx is making him sick, he is having sharp pains in his stomach. Patient stated that he has stop taking the medication. Patient states he is still feeling the same- Please advise

## 2021-03-26 ENCOUNTER — Encounter: Payer: Self-pay | Admitting: Podiatry

## 2021-03-26 ENCOUNTER — Other Ambulatory Visit: Payer: Self-pay

## 2021-03-26 ENCOUNTER — Ambulatory Visit (INDEPENDENT_AMBULATORY_CARE_PROVIDER_SITE_OTHER): Payer: 59 | Admitting: Podiatry

## 2021-03-26 VITALS — BP 129/70 | HR 70 | Temp 99.4°F

## 2021-03-26 DIAGNOSIS — M216X2 Other acquired deformities of left foot: Secondary | ICD-10-CM | POA: Diagnosis not present

## 2021-03-26 DIAGNOSIS — M722 Plantar fascial fibromatosis: Secondary | ICD-10-CM | POA: Diagnosis not present

## 2021-03-26 DIAGNOSIS — M21862 Other specified acquired deformities of left lower leg: Secondary | ICD-10-CM

## 2021-03-26 NOTE — Progress Notes (Signed)
Subjective:  Patient ID: Lawrence Randolph, male    DOB: [birth year],  MRN: [id number]  Chief Complaint  Patient presents with  . Routine Post Op    POV#1 DOS 03/18/21 EPF LEFT, LEFT GASTROENEMIUS RECESS   *  50 y.o. male returns for post-op check.  Patient is doing well.  He states that he has been able to ambulate with the boot on.  He has not been taking much pain medication.  He denies any other acute complaints.  He is ambulating with a cam boot on.  Review of Systems: Negative except as noted in the HPI. Denies N/V/F/Ch.  Past Medical History:  Diagnosis Date  . Cancer ([place]) 09/28/2005   hodgkins lymphoma  stage I Hodgkin's disease.  Marland Kitchen ERECTILE DYSFUNCTION 10/11/2007   Qualifier: Diagnosis of  By: Jenny Reichmann MD, [place] 06/08/2008   Qualifier: Diagnosis of  By: Jenny Reichmann MD, [place]   . GERD (gastroesophageal reflux disease)   . HYPERLIPIDEMIA 10/18/2007   Qualifier: Diagnosis of  By: Jenny Reichmann MD, [place] Oris   . HYPOGONADISM, MALE 10/18/2007   Qualifier: Diagnosis of  By: Jenny Reichmann MD, [place] Oris     Current Outpatient Medications:  .  amLODipine (NORVASC) 5 MG tablet, Take 1 tablet (5 mg total) by mouth daily., Disp: 90 tablet, Rfl: 3 .  HYDROcodone-acetaminophen (NORCO) 5-325 MG tablet, Take 1 tablet by mouth every 6 (six) hours as needed for moderate pain., Disp: 30 tablet, Rfl: 0 .  ibuprofen (ADVIL) 800 MG tablet, Take 1 tablet (800 mg total) by mouth every 6 (six) hours as needed., Disp: 60 tablet, Rfl: 1 .  methylPREDNISolone (MEDROL) 4 MG tablet, Take as directed, Disp: 21 tablet, Rfl: 0 .  oxyCODONE-acetaminophen (PERCOCET) 5-325 MG tablet, Take 1-2 tablets by mouth every 4 (four) hours as needed for severe pain., Disp: 30 tablet, Rfl: 0 .  oxyCODONE-acetaminophen (PERCOCET) 5-325 MG tablet, Take 2 tablets by mouth every 4 (four) hours as needed for severe pain., Disp: 30 tablet, Rfl: 0 .  oxyCODONE-acetaminophen (PERCOCET) 5-325 MG tablet, Take 1 tablet by mouth every 6 (six) hours  as needed for severe pain., Disp: 30 tablet, Rfl: 0 .  pantoprazole (PROTONIX) 20 MG tablet, Take 1 tablet (20 mg total) by mouth daily., Disp: 90 tablet, Rfl: 3 .  rosuvastatin (CRESTOR) 20 MG tablet, Take 1 tablet (20 mg total) by mouth daily., Disp: 90 tablet, Rfl: 3 .  valACYclovir (VALTREX) 1000 MG tablet, Take 1 tablet (1,000 mg total) by mouth 2 (two) times daily., Disp: 14 tablet, Rfl: 5 .  vardenafil (LEVITRA) 20 MG tablet, Take 1 tablet (20 mg total) by mouth as needed for erectile dysfunction., Disp: 10 tablet, Rfl: 11  Social History   Tobacco Use  Smoking Status Current Every Day Smoker  . Packs/day: 0.25  . Years: 15.00  . Pack years: 3.75  . Types: Cigarettes  Smokeless Tobacco Never Used    No Known Allergies Objective:   Vitals:   03/26/21 0857  BP: 129/70  Pulse: 70  Temp: 99.4 F (37.4 C)   There is no height or weight on file to calculate BMI. Constitutional Well developed. Well nourished.  Vascular Foot warm and well perfused. Capillary refill normal to all digits.   Neurologic Normal speech. Oriented to person, place, and time. Epicritic sensation to light touch grossly present bilaterally.  Dermatologic Skin healing well without signs of infection. Skin edges well coapted without signs of infection.  Orthopedic: Tenderness to  palpation noted about the surgical site.   Radiographs: None Assessment:   1. Gastrocnemius equinus, left   2. Plantar fasciitis of left foot    Plan:  Patient was evaluated and treated and all questions answered.  S/p foot surgery left -Progressing as expected post-operatively. -XR: None -WB Status: Weightbearing as tolerated in cam boot -Sutures: Intact.  No clinical signs of dehiscence noted.  No clinical signs of infection noted. -Medications: None -Foot redressed.  No follow-ups on file.

## 2021-03-28 DIAGNOSIS — M79676 Pain in unspecified toe(s): Secondary | ICD-10-CM

## 2021-04-09 ENCOUNTER — Encounter: Payer: Self-pay | Admitting: Podiatry

## 2021-04-09 ENCOUNTER — Other Ambulatory Visit: Payer: Self-pay

## 2021-04-09 ENCOUNTER — Ambulatory Visit (INDEPENDENT_AMBULATORY_CARE_PROVIDER_SITE_OTHER): Payer: 59 | Admitting: Podiatry

## 2021-04-09 DIAGNOSIS — M216X2 Other acquired deformities of left foot: Secondary | ICD-10-CM

## 2021-04-09 DIAGNOSIS — Z9889 Other specified postprocedural states: Secondary | ICD-10-CM

## 2021-04-09 DIAGNOSIS — M722 Plantar fascial fibromatosis: Secondary | ICD-10-CM

## 2021-04-09 DIAGNOSIS — M21862 Other specified acquired deformities of left lower leg: Secondary | ICD-10-CM

## 2021-04-09 MED ORDER — DOXYCYCLINE HYCLATE 100 MG PO TABS
100.0000 mg | ORAL_TABLET | Freq: Two times a day (BID) | ORAL | 0 refills | Status: DC
Start: 2021-04-09 — End: 2021-11-25

## 2021-04-09 NOTE — Progress Notes (Signed)
Subjective:  Patient ID: Lawrence Randolph, male    DOB: 27-Feb-1971,  MRN: [id number]  Chief Complaint  Patient presents with  . Routine Post Op    POV#2 DOS 03/18/21 EPF LEFT, LEFT GASTROENEMIUS RECESS   "its doing good, feels a little tight"    Sutures and staples removed   *  50 y.o. male returns for post-op check.  Patient is doing well.  He states is doing well he is ambulating regular shoes..  He has not been taking much pain medication.  He denies any other acute complaints.  He is ambulating with a cam boot on.  Review of Systems: Negative except as noted in the HPI. Denies N/V/F/Ch.  Past Medical History:  Diagnosis Date  . Cancer ([place]) 09/28/2005   hodgkins lymphoma  stage I Hodgkin's disease.  Marland Kitchen ERECTILE DYSFUNCTION 10/11/2007   Qualifier: Diagnosis of  By: Jenny Reichmann MD, [place] 06/08/2008   Qualifier: Diagnosis of  By: Jenny Reichmann MD, [place]   . GERD (gastroesophageal reflux disease)   . HYPERLIPIDEMIA 10/18/2007   Qualifier: Diagnosis of  By: Jenny Reichmann MD, [place] Oris   . HYPOGONADISM, MALE 10/18/2007   Qualifier: Diagnosis of  By: Jenny Reichmann MD, [place] Oris     Current Outpatient Medications:  .  doxycycline (VIBRA-TABS) 100 MG tablet, Take 1 tablet (100 mg total) by mouth 2 (two) times daily., Disp: 28 tablet, Rfl: 0 .  amLODipine (NORVASC) 5 MG tablet, Take 1 tablet (5 mg total) by mouth daily., Disp: 90 tablet, Rfl: 3 .  HYDROcodone-acetaminophen (NORCO) 5-325 MG tablet, Take 1 tablet by mouth every 6 (six) hours as needed for moderate pain., Disp: 30 tablet, Rfl: 0 .  ibuprofen (ADVIL) 800 MG tablet, Take 1 tablet (800 mg total) by mouth every 6 (six) hours as needed., Disp: 60 tablet, Rfl: 1 .  methylPREDNISolone (MEDROL) 4 MG tablet, Take as directed, Disp: 21 tablet, Rfl: 0 .  oxyCODONE-acetaminophen (PERCOCET) 5-325 MG tablet, Take 1-2 tablets by mouth every 4 (four) hours as needed for severe pain., Disp: 30 tablet, Rfl: 0 .  oxyCODONE-acetaminophen (PERCOCET) 5-325 MG tablet,  Take 2 tablets by mouth every 4 (four) hours as needed for severe pain., Disp: 30 tablet, Rfl: 0 .  oxyCODONE-acetaminophen (PERCOCET) 5-325 MG tablet, Take 1 tablet by mouth every 6 (six) hours as needed for severe pain., Disp: 30 tablet, Rfl: 0 .  pantoprazole (PROTONIX) 20 MG tablet, Take 1 tablet (20 mg total) by mouth daily., Disp: 90 tablet, Rfl: 3 .  rosuvastatin (CRESTOR) 20 MG tablet, Take 1 tablet (20 mg total) by mouth daily., Disp: 90 tablet, Rfl: 3 .  valACYclovir (VALTREX) 1000 MG tablet, Take 1 tablet (1,000 mg total) by mouth 2 (two) times daily., Disp: 14 tablet, Rfl: 5 .  vardenafil (LEVITRA) 20 MG tablet, Take 1 tablet (20 mg total) by mouth as needed for erectile dysfunction., Disp: 10 tablet, Rfl: 11  Social History   Tobacco Use  Smoking Status Current Every Day Smoker  . Packs/day: 0.25  . Years: 15.00  . Pack years: 3.75  . Types: Cigarettes  Smokeless Tobacco Never Used    No Known Allergies Objective:   There were no vitals filed for this visit. There is no height or weight on file to calculate BMI. Constitutional Well developed. Well nourished.  Vascular Foot warm and well perfused. Capillary refill normal to all digits.   Neurologic Normal speech. Oriented to person, place, and time. Epicritic sensation to  light touch grossly present bilaterally.  Dermatologic  skin completely epithelialized.  Skin edges well coapted without signs of infection.  Good range of motion noted at the ankle joint.  Greater than 10 degrees of dorsiflexion noted.  No pain  Pain on palpation to the incision sites  Orthopedic:  No tenderness to palpation noted about the surgical site.   Radiographs: None Assessment:   1. Gastrocnemius equinus, left   2. Plantar fasciitis of left foot   3. Status post foot surgery    Plan:  Patient was evaluated and treated and all questions answered.  S/p foot surgery left -Progressing as expected post-operatively. -XR: None -WB  Status: Transition to weightbearing as tolerated regular shoes -Sutures: Removed.  No clinical signs of dehiscence noted.  No clinical signs of infection noted. -Medications: None -Foot redressed.  No follow-ups on file.

## 2021-09-06 ENCOUNTER — Ambulatory Visit
Admission: EM | Admit: 2021-09-06 | Discharge: 2021-09-06 | Disposition: A | Discharge status: Home / Self Care | Payer: 59 | Attending: Emergency Medicine | Admitting: Emergency Medicine

## 2021-09-06 ENCOUNTER — Other Ambulatory Visit: Payer: Self-pay

## 2021-09-06 ENCOUNTER — Encounter: Payer: Self-pay | Admitting: Emergency Medicine

## 2021-09-06 DIAGNOSIS — I10 Essential (primary) hypertension: Secondary | ICD-10-CM | POA: Diagnosis not present

## 2021-09-06 DIAGNOSIS — H6123 Impacted cerumen, bilateral: Secondary | ICD-10-CM | POA: Diagnosis not present

## 2021-09-06 NOTE — Discharge Instructions (Addendum)
Your earwax was removed today.    Your blood pressure is elevated today at 145/93.  Please have this rechecked by your primary care provider in 2-4 weeks.

## 2021-09-06 NOTE — ED Provider Notes (Signed)
[name]    CSN: [id number] Arrival date & time: 09/06/21  0800      History   Chief Complaint Chief Complaint  Patient presents with   Ear Fullness    Right     HPI ELIMELECH HOUSEMAN is a 50 y.o. male.  Patient presents with ear "fullness" and muffled hearing since yesterday.  He denies fever, chills, ear pain, sore throat, cough, shortness of breath, or other symptoms.  No treatments attempted at home.  His medical history includes hypertension.  The history is provided by the patient and medical records.   Past Medical History:  Diagnosis Date   Cancer ([place]) 09/28/2005   hodgkins lymphoma  stage I Hodgkin's disease.   ERECTILE DYSFUNCTION 10/11/2007   Qualifier: Diagnosis of  By: Jenny Reichmann MD, [place] 06/08/2008   Qualifier: Diagnosis of  By: Jenny Reichmann MD, Hunt Oris    GERD (gastroesophageal reflux disease)    HYPERLIPIDEMIA 10/18/2007   Qualifier: Diagnosis of  By: Jenny Reichmann MD, Shannan Harper, MALE 10/18/2007   Qualifier: Diagnosis of  By: Jenny Reichmann MD, Hunt Oris     Patient Active Problem List   Diagnosis Date Noted   Allergic rhinitis 12/13/2020   HTN (hypertension) 11/06/2020   Preventative health care 10/09/2020   Acute upper respiratory infection 04/17/2020   Cellulitis of knee 04/17/2020   Vitamin D deficiency 09/17/2019   Hand cramps 09/16/2019   Carpal tunnel syndrome 10/17/2015   Prostatitis 10/12/2013   Hodgkin's disease ([place])    Genital herpes 06/08/2008   HYPOGONADISM, MALE 10/18/2007   HYPERLIPIDEMIA 10/18/2007   ERECTILE DYSFUNCTION 10/11/2007    Past Surgical History:  Procedure Laterality Date   NECK SURGERY Right 04/2011   biopsy - stage I Hodgkin's disease.   right VATS  04/2011   for organizing chronic inflammation RUL       Home Medications    Prior to Admission medications   Medication Sig Start Date End Date Taking? Authorizing Provider  amLODipine (NORVASC) 5 MG tablet Take 1 tablet (5 mg total) by mouth daily.  11/06/20 11/06/21 Yes Biagio Borg, MD  pantoprazole (PROTONIX) 20 MG tablet Take 1 tablet (20 mg total) by mouth daily. 10/09/20  Yes Biagio Borg, MD  rosuvastatin (CRESTOR) 20 MG tablet Take 1 tablet (20 mg total) by mouth daily. 10/09/20  Yes Biagio Borg, MD  valACYclovir (VALTREX) 1000 MG tablet Take 1 tablet (1,000 mg total) by mouth 2 (two) times daily. 09/16/19  Yes Biagio Borg, MD  vardenafil (LEVITRA) 20 MG tablet Take 1 tablet (20 mg total) by mouth as needed for erectile dysfunction. 10/09/20  Yes Biagio Borg, MD  doxycycline (VIBRA-TABS) 100 MG tablet Take 1 tablet (100 mg total) by mouth 2 (two) times daily. 04/09/21   Felipa Furnace, DPM  HYDROcodone-acetaminophen (NORCO) 5-325 MG tablet Take 1 tablet by mouth every 6 (six) hours as needed for moderate pain. 03/22/21   Felipa Furnace, DPM  ibuprofen (ADVIL) 800 MG tablet Take 1 tablet (800 mg total) by mouth every 6 (six) hours as needed. 03/18/21   Felipa Furnace, DPM  methylPREDNISolone (MEDROL) 4 MG tablet Take as directed 01/08/21   Felipa Furnace, DPM  oxyCODONE-acetaminophen (PERCOCET) 5-325 MG tablet Take 1-2 tablets by mouth every 4 (four) hours as needed for severe pain. 03/18/21   Felipa Furnace, DPM  oxyCODONE-acetaminophen (PERCOCET) 5-325 MG tablet Take 2 tablets by mouth every  4 (four) hours as needed for severe pain. 03/18/21   Felipa Furnace, DPM  oxyCODONE-acetaminophen (PERCOCET) 5-325 MG tablet Take 1 tablet by mouth every 6 (six) hours as needed for severe pain. 03/18/21   Felipa Furnace, DPM    Family History Family History  Problem Relation Age of Onset   Prostate cancer Father    Hypertension Other    Diabetes Other    Prostate cancer Paternal Grandfather    Prostate cancer Paternal Uncle    Colon cancer Neg Hx    Rectal cancer Neg Hx    Stomach cancer Neg Hx     Social History Social History   Tobacco Use   Smoking status: Every Day    Packs/day: 0.25    Years: 15.00    Pack years: 3.75    Types:  Cigarettes   Smokeless tobacco: Never  Vaping Use   Vaping Use: Never used  Substance Use Topics   Alcohol use: No   Drug use: No     Allergies   Patient has no known allergies.   Review of Systems Review of Systems  Constitutional:  Negative for chills and fever.  HENT:  Positive for hearing loss. Negative for ear discharge, ear pain and sore throat.   Respiratory:  Negative for cough and shortness of breath.   Cardiovascular:  Negative for chest pain and palpitations.  Skin:  Negative for color change and rash.  All other systems reviewed and are negative.   Physical Exam Triage Vital Signs ED Triage Vitals  Enc Vitals Group     BP      Pulse      Resp      Temp      Temp src      SpO2      Weight      Height      Head Circumference      Peak Flow      Pain Score      Pain Loc      Pain Edu?      Excl. in [place]?    No data found.  Updated Vital Signs BP (!) 145/93 (BP Location: Left Arm)   Pulse 60   Temp 98 F (36.7 C) (Oral)   SpO2 98%   Visual Acuity Right Eye Distance:   Left Eye Distance:   Bilateral Distance:    Right Eye Near:   Left Eye Near:    Bilateral Near:     Physical Exam Vitals and nursing note reviewed.  Constitutional:      General: He is not in acute distress.    Appearance: He is well-developed. He is not ill-appearing.  HENT:     Head: Normocephalic and atraumatic.     Right Ear: There is impacted cerumen.     Left Ear: There is impacted cerumen.     Nose: Nose normal.     Mouth/Throat:     Mouth: Mucous membranes are moist.     Pharynx: Oropharynx is clear.  Eyes:     Conjunctiva/sclera: Conjunctivae normal.  Cardiovascular:     Rate and Rhythm: Normal rate and regular rhythm.     Heart sounds: No murmur heard. Pulmonary:     Effort: Pulmonary effort is normal. No respiratory distress.     Breath sounds: Normal breath sounds.  Abdominal:     Palpations: Abdomen is soft.     Tenderness: There is no abdominal  tenderness.  Musculoskeletal:  Cervical back: Neck supple.  Skin:    General: Skin is warm and dry.  Neurological:     General: No focal deficit present.     Mental Status: He is alert and oriented to person, place, and time.     Gait: Gait normal.  Psychiatric:        Mood and Affect: Mood normal.        Behavior: Behavior normal.     UC Treatments / Results  Labs (all labs ordered are listed, but only abnormal results are displayed) Labs Reviewed - No data to display  EKG   Radiology No results found.  Procedures Procedures (including critical care time)  Medications Ordered in UC Medications - No data to display  Initial Impression / Assessment and Plan / UC Course  I have reviewed the triage vital signs and the nursing notes.  Pertinent labs & imaging results that were available during my care of the patient were reviewed by me and considered in my medical decision making (see chart for details).  Bilateral cerumen impaction. Elevated blood pressure with HTN.  Cerumen removed via irrigation by RN.  Patient reports relief of his symptoms.  TMs visualized to be clear after cerumen removed.  Discussed with patient that his blood pressure is elevated today and needs to be rechecked by his PCP in 2 to 4 weeks.  He agrees to plan of care.   Final Clinical Impressions(s) / UC Diagnoses   Final diagnoses:  Bilateral impacted cerumen  Elevated blood pressure reading in office with diagnosis of hypertension     Discharge Instructions      Your earwax was removed today.    Your blood pressure is elevated today at 145/93.  Please have this rechecked by your primary care provider in 2-4 weeks.          ED Prescriptions   None    PDMP not reviewed this encounter.   Sharion Balloon, NP 09/06/21 [phone]

## 2021-09-06 NOTE — ED Triage Notes (Signed)
Right ear feels clogged started yesterday

## 2021-10-29 ENCOUNTER — Other Ambulatory Visit: Payer: Self-pay | Admitting: Internal Medicine

## 2021-10-29 NOTE — Telephone Encounter (Signed)
Please refill as per office routine med refill policy (all routine meds to be refilled for 3 mo or monthly (per pt preference) up to one year from last visit, then month to month grace period for 3 mo, then further med refills will have to be denied) ? ?

## 2021-10-30 ENCOUNTER — Telehealth: Payer: Self-pay | Admitting: Internal Medicine

## 2021-10-30 MED ORDER — ROSUVASTATIN CALCIUM 20 MG PO TABS: 20.0000 mg | ORAL_TABLET | Freq: Every day | ORAL | 0 refills | Status: DC

## 2021-10-30 NOTE — Telephone Encounter (Signed)
1.Medication Requested: rosuvastatin (CRESTOR) 20 MG tablet  2. Pharmacy (Name, Street, [place]): [facility]  Phone:  [phone] Fax:  [phone]   3. On Med List: yes  4. Last Visit with PCP: 12.14.21  5. Next visit date with PCP: 12.12.22  **Patient requesting short fill until visit**   Agent: Please be advised that RX refills may take up to 3 business days. We ask that you follow-up with your pharmacy.

## 2021-10-30 NOTE — Addendum Note (Signed)
Addended by: Biagio Borg on: 10/30/2021 12:46 PM   Modules accepted: Orders

## 2021-11-10 ENCOUNTER — Other Ambulatory Visit: Payer: Self-pay | Admitting: Internal Medicine

## 2021-11-10 NOTE — Telephone Encounter (Signed)
Please refill as per office routine med refill policy (all routine meds to be refilled for 3 mo or monthly (per pt preference) up to one year from last visit, then month to month grace period for 3 mo, then further med refills will have to be denied) ? ?

## 2021-11-11 NOTE — Telephone Encounter (Signed)
1.Medication Requested: amLODipine   2. Pharmacy (Name, Street, [place]): [facility]   3. On Med List: yes  4. Last Visit with PCP: 9.23.22  5. Next visit date with PCP: 12.12.22   Agent: Please be advised that RX refills may take up to 3 business days. We ask that you follow-up with your pharmacy.

## 2021-11-25 ENCOUNTER — Other Ambulatory Visit: Payer: Self-pay

## 2021-11-25 ENCOUNTER — Encounter: Payer: Self-pay | Admitting: Internal Medicine

## 2021-11-25 ENCOUNTER — Ambulatory Visit (INDEPENDENT_AMBULATORY_CARE_PROVIDER_SITE_OTHER): Payer: 59 | Admitting: Internal Medicine

## 2021-11-25 VITALS — BP 130/80 | HR 82 | Temp 98.1°F | Ht 75.0 in | Wt 278.2 lb

## 2021-11-25 DIAGNOSIS — N529 Male erectile dysfunction, unspecified: Secondary | ICD-10-CM | POA: Diagnosis not present

## 2021-11-25 DIAGNOSIS — Z0001 Encounter for general adult medical examination with abnormal findings: Secondary | ICD-10-CM | POA: Diagnosis not present

## 2021-11-25 DIAGNOSIS — E782 Mixed hyperlipidemia: Secondary | ICD-10-CM

## 2021-11-25 DIAGNOSIS — E538 Deficiency of other specified B group vitamins: Secondary | ICD-10-CM

## 2021-11-25 DIAGNOSIS — I10 Essential (primary) hypertension: Secondary | ICD-10-CM

## 2021-11-25 DIAGNOSIS — E559 Vitamin D deficiency, unspecified: Secondary | ICD-10-CM | POA: Diagnosis not present

## 2021-11-25 DIAGNOSIS — C8190 Hodgkin lymphoma, unspecified, unspecified site: Secondary | ICD-10-CM | POA: Diagnosis not present

## 2021-11-25 DIAGNOSIS — Z23 Encounter for immunization: Secondary | ICD-10-CM | POA: Diagnosis not present

## 2021-11-25 LAB — CBC WITH DIFFERENTIAL/PLATELET
Basophils Absolute: 0 10*3/uL (ref 0.0–0.1)
Basophils Relative: 0.4 % (ref 0.0–3.0)
Eosinophils Absolute: 0.1 10*3/uL (ref 0.0–0.7)
Eosinophils Relative: 0.8 % (ref 0.0–5.0)
HCT: 42.9 % (ref 39.0–52.0)
Hemoglobin: 14 g/dL (ref 13.0–17.0)
Lymphocytes Relative: 28.4 % (ref 12.0–46.0)
Lymphs Abs: 2.1 10*3/uL (ref 0.7–4.0)
MCHC: 32.7 g/dL (ref 30.0–36.0)
MCV: 88.3 fl (ref 78.0–100.0)
Monocytes Absolute: 0.5 10*3/uL (ref 0.1–1.0)
Monocytes Relative: 7.5 % (ref 3.0–12.0)
Neutro Abs: 4.6 10*3/uL (ref 1.4–7.7)
Neutrophils Relative %: 62.9 % (ref 43.0–77.0)
Platelets: 210 10*3/uL (ref 150.0–400.0)
RBC: 4.86 Mil/uL (ref 4.22–5.81)
RDW: 14.3 % (ref 11.5–15.5)
WBC: 7.3 10*3/uL (ref 4.0–10.5)

## 2021-11-25 LAB — BASIC METABOLIC PANEL
BUN: 15 mg/dL (ref 6–23)
CO2: 31 mEq/L (ref 19–32)
Calcium: 9.8 mg/dL (ref 8.4–10.5)
Chloride: 105 mEq/L (ref 96–112)
Creatinine, Ser: 1.11 mg/dL (ref 0.40–1.50)
GFR: 76.4 mL/min (ref >60.00)
Glucose, Bld: 91 mg/dL (ref 70–99)
Potassium: 4.4 mEq/L (ref 3.5–5.1)
Sodium: 142 mEq/L (ref 135–145)

## 2021-11-25 LAB — LIPID PANEL
Cholesterol: 151 mg/dL (ref 0–200)
HDL: 43.7 mg/dL (ref >39.00)
LDL Cholesterol: 76 mg/dL (ref 0–99)
NonHDL: 107.1
Total CHOL/HDL Ratio: 3
Triglycerides: 156 mg/dL — ABNORMAL HIGH (ref 0.0–149.0)
VLDL: 31.2 mg/dL (ref 0.0–40.0)

## 2021-11-25 LAB — HEPATIC FUNCTION PANEL
ALT: 40 U/L (ref 0–53)
AST: 23 U/L (ref 0–37)
Albumin: 4.5 g/dL (ref 3.5–5.2)
Alkaline Phosphatase: 88 U/L (ref 39–117)
Bilirubin, Direct: 0.1 mg/dL (ref 0.0–0.3)
Total Bilirubin: 0.5 mg/dL (ref 0.2–1.2)
Total Protein: 7.1 g/dL (ref 6.0–8.3)

## 2021-11-25 LAB — URINALYSIS, ROUTINE W REFLEX MICROSCOPIC
Bilirubin Urine: NEGATIVE
Hgb urine dipstick: NEGATIVE
Ketones, ur: NEGATIVE
Leukocytes,Ua: NEGATIVE
Nitrite: NEGATIVE
RBC / HPF: NONE SEEN (ref 0–?)
Specific Gravity, Urine: >=1.03 — AB (ref 1.000–1.030)
Total Protein, Urine: NEGATIVE
Urine Glucose: NEGATIVE
Urobilinogen, UA: 0.2 (ref 0.0–1.0)
pH: 5.5 (ref 5.0–8.0)

## 2021-11-25 LAB — TSH: TSH: 1.15 u[IU]/mL (ref 0.35–5.50)

## 2021-11-25 LAB — VITAMIN D 25 HYDROXY (VIT D DEFICIENCY, FRACTURES): VITD: 19.24 ng/mL — ABNORMAL LOW (ref 30.00–100.00)

## 2021-11-25 LAB — PSA: PSA: 1.09 ng/mL (ref 0.10–4.00)

## 2021-11-25 LAB — VITAMIN B12: Vitamin B-12: 357 pg/mL (ref 211–911)

## 2021-11-25 MED ORDER — ROSUVASTATIN CALCIUM 20 MG PO TABS: 20.0000 mg | ORAL_TABLET | Freq: Every day | ORAL | 3 refills | Status: DC

## 2021-11-25 MED ORDER — TADALAFIL 20 MG PO TABS: 10.0000 mg | ORAL_TABLET | ORAL | 11 refills | Status: DC | PRN

## 2021-11-25 MED ORDER — PANTOPRAZOLE SODIUM 20 MG PO TBEC: 20.0000 mg | DELAYED_RELEASE_TABLET | Freq: Every day | ORAL | 3 refills | Status: DC

## 2021-11-25 MED ORDER — CHOLECALCIFEROL 50 MCG (2000 UT) PO TABS: ORAL_TABLET | ORAL | 99 refills | Status: AC

## 2021-11-25 MED ORDER — VALACYCLOVIR HCL 1 G PO TABS: 1000.0000 mg | ORAL_TABLET | Freq: Two times a day (BID) | ORAL | 5 refills | Status: DC

## 2021-11-25 MED ORDER — AMLODIPINE BESYLATE 5 MG PO TABS: 5.0000 mg | ORAL_TABLET | Freq: Every day | ORAL | 3 refills | Status: DC

## 2021-11-25 NOTE — Patient Instructions (Addendum)
You had the Prevnar 20 pneumonia shot today  Please consider the shingles shot at [facility]   Please take all new medication as prescribed - the cialis as needed  Please take OTC Vitamin D3 at 2000 units per day, indefinitely  Please continue all other medications as before, and refills have been done if requested.  Please have the pharmacy call with any other refills you may need.  Please continue your efforts at being more active, low cholesterol diet, and weight control.  You are otherwise up to date with prevention measures today.  Please keep your appointments with your specialists as you may have planned  You will be contacted regarding the referral for: Oncology for the history of [place]  Please go to the LAB at the blood drawing area for the tests to be done  You will be contacted by phone if any changes need to be made immediately.  Otherwise, you will receive a letter about your results with an explanation, but please check with MyChart first.   Please remember to sign up for MyChart if you have not done so, as this will be important to you in the future with finding out test results, communicating by private email, and scheduling acute appointments online when needed.  Please make an Appointment to return for your 1 year visit, or sooner if needed, with Lab testing by Appointment as well, to be done about 3-5 days before at the [facility] (so this is for TWO appointments - please see the scheduling desk as you leave)  Due to the ongoing Covid 19 pandemic, our lab now requires an appointment for any labs done at our office.  If you need labs done and do not have an appointment, please call our office ahead of time to schedule before presenting to the lab for your testing.

## 2021-11-25 NOTE — Progress Notes (Signed)
Patient ID: Lawrence Randolph, male   DOB: [birth year], 50 y.o.   MRN: [id number]         Chief Complaint:: wellness exam and hx of hodgkins ymphoma, low vit d and ED       HPI:  Lawrence Randolph is a 50 y.o. male here for wellness exam; declines shignrix, but for prevnar 20, o/w up to date.  Still smoking, not ready to quit                        Also no hx of covid infection.  Asks for oncology f/u for hodgkins lymphoma as has not been seen in several years,  Pt denies chest pain, increased sob or doe, wheezing, orthopnea, PND, increased LE swelling, palpitations, dizziness or syncope.   Pt denies polydipsia, polyuria, or new focal neuro s/s.   Pt denies fever, wt loss, night sweats, loss of appetite, or other constitutional symptoms  Not taking Vit d  Also has worsening ED symptoms in last 6 months or so, asks for trial cialis.    Wt Readings from Last 3 Encounters:  11/25/21 278 lb 3.2 oz (126.2 kg)  11/27/20 283 lb (128.4 kg)  11/06/20 284 lb (128.8 kg)   BP Readings from Last 3 Encounters:  11/25/21 130/80  09/06/21 (!) 145/93  03/26/21 129/70   Immunization History  Administered Date(s) Administered   Influenza Split 09/21/2013, 09/14/2014   Influenza,inj,Quad PF,6+ Mos 09/16/2019   Influenza-Unspecified 10/14/2015, 09/25/2020, 10/28/2021   PFIZER(Purple Top)SARS-COV-2 Vaccination 04/24/2020, 05/15/2020, 10/28/2021   PNEUMOCOCCAL CONJUGATE-20 11/25/2021   Tdap 10/09/2020   There are no preventive care reminders to display for this patient.     Past Medical History:  Diagnosis Date   Cancer ([place]) 09/28/2005   hodgkins lymphoma  stage I Hodgkin's disease.   ERECTILE DYSFUNCTION 10/11/2007   Qualifier: Diagnosis of  By: Jenny Reichmann MD, [place] 06/08/2008   Qualifier: Diagnosis of  By: Jenny Reichmann MD, Hunt Oris    GERD (gastroesophageal reflux disease)    HYPERLIPIDEMIA 10/18/2007   Qualifier: Diagnosis of  By: Jenny Reichmann MD, Shannan Harper, MALE 10/18/2007   Qualifier:  Diagnosis of  By: Jenny Reichmann MD, Hunt Oris    Past Surgical History:  Procedure Laterality Date   NECK SURGERY Right 04/2011   biopsy - stage I Hodgkin's disease.   right VATS  04/2011   for organizing chronic inflammation RUL    reports that he has been smoking cigarettes. He has a 3.75 pack-year smoking history. He has never used smokeless tobacco. He reports that he does not drink alcohol and does not use drugs. family history includes Diabetes in an other family member; Hypertension in an other family member; Prostate cancer in his father, paternal grandfather, and paternal uncle. No Known Allergies No current outpatient medications on file prior to visit.   No current facility-administered medications on file prior to visit.        ROS:  All others reviewed and negative.  Objective        PE:  BP 130/80 (BP Location: Left Arm, Patient Position: Sitting, Cuff Size: Normal)   Pulse 82   Temp 98.1 F (36.7 C) (Oral)   Ht 6\' 3"  (1.905 m)   Wt 278 lb 3.2 oz (126.2 kg)   SpO2 97%   BMI 34.77 kg/m                 Constitutional:  Pt appears in NAD               HENT: Head: NCAT.                Right Ear: External ear normal.                 Left Ear: External ear normal.                Eyes: . Pupils are equal, round, and reactive to light. Conjunctivae and EOM are normal               Nose: without d/c or deformity               Neck: Neck supple. Gross normal ROM               Cardiovascular: Normal rate and regular rhythm.                 Pulmonary/Chest: Effort normal and breath sounds without rales or wheezing.                Abd:  Soft, NT, ND, + BS, no organomegaly               Neurological: Pt is alert. At baseline orientation, motor grossly intact               Skin: Skin is warm. No rashes, no other new lesions, LE edema - none               Psychiatric: Pt behavior is normal without agitation   Micro: none  Cardiac tracings I have personally interpreted today:   none  Pertinent Radiological findings (summarize): none   Lab Results  Component Value Date   WBC 7.3 11/25/2021   HGB 14.0 11/25/2021   HCT 42.9 11/25/2021   PLT 210.0 11/25/2021   GLUCOSE 91 11/25/2021   CHOL 151 11/25/2021   TRIG 156.0 (H) 11/25/2021   HDL 43.70 11/25/2021   LDLDIRECT 168.0 09/09/2018   LDLCALC 76 11/25/2021   ALT 40 11/25/2021   AST 23 11/25/2021   NA 142 11/25/2021   K 4.4 11/25/2021   CL 105 11/25/2021   CREATININE 1.11 11/25/2021   BUN 15 11/25/2021   CO2 31 11/25/2021   TSH 1.15 11/25/2021   PSA 1.09 11/25/2021   Assessment/Plan:  Lawrence Randolph is a 50 y.o. Black or African American [2] male with  has a past medical history of Cancer ([place]) (09/28/2005), ERECTILE DYSFUNCTION (10/11/2007), GENITAL HERPES (06/08/2008), GERD (gastroesophageal reflux disease), HYPERLIPIDEMIA (10/18/2007), and HYPOGONADISM, MALE (10/18/2007).  Hodgkin's disease ([place]) Has been lost to f/u for several years, asks for f/u referral,  to f/u any worsening symptoms or concerns  Encounter for well adult exam with abnormal findings Age and sex appropriate education and counseling updated with regular exercise and diet Referrals for preventative services - none needed Immunizations addressed - for prevnar 20 today Smoking counseling  - counseled to quit - pt not ready Evidence for depression or other mood disorder - none significant Most recent labs reviewed. I have personally reviewed and have noted: 1) the patient's medical and social history 2) The patient's current medications and supplements 3) The patient's height, weight, and BMI have been recorded in the chart   HYPERLIPIDEMIA Lab Results  Component Value Date   LDLCALC 76 11/25/2021   Stable, pt to continue current lipitor 20   Erectile dysfunction New recent worsening , for cialis  prn,  to f/u any worsening symptoms or concerns  Vitamin D deficiency Last vitamin D Lab Results  Component Value Date   VD25OH  19.24 (L) 11/25/2021   Low, to start oral replacement   HTN (hypertension) BP Readings from Last 3 Encounters:  11/25/21 130/80  09/06/21 (!) 145/93  03/26/21 129/70   Stable, pt to continue medical treatment norvasc 5 qd  Followup: Return in about 1 year (around 11/25/2022).  Cathlean Cower, MD 11/28/2021 4:11 AM [facility]

## 2021-11-28 ENCOUNTER — Encounter: Payer: Self-pay | Admitting: Internal Medicine

## 2021-11-28 ENCOUNTER — Other Ambulatory Visit: Payer: Self-pay | Admitting: Internal Medicine

## 2021-11-28 ENCOUNTER — Telehealth: Payer: Self-pay | Admitting: Internal Medicine

## 2021-11-28 NOTE — Assessment & Plan Note (Signed)
Age and sex appropriate education and counseling updated with regular exercise and diet Referrals for preventative services - none needed Immunizations addressed - for prevnar 20 today Smoking counseling  - counseled to quit - pt not ready Evidence for depression or other mood disorder - none significant Most recent labs reviewed. I have personally reviewed and have noted: 1) the patient's medical and social history 2) The patient's current medications and supplements 3) The patient's height, weight, and BMI have been recorded in the chart

## 2021-11-28 NOTE — Assessment & Plan Note (Signed)
Lab Results  Component Value Date   LDLCALC 76 11/25/2021   Stable, pt to continue current lipitor 20

## 2021-11-28 NOTE — Telephone Encounter (Signed)
Left detailed message on patient's voice mail.

## 2021-11-28 NOTE — Telephone Encounter (Signed)
Patient calling in  Patient says he spoke w/ provider at recent OV about getting a full body cat scan done.. patient says he was diagnosed w/ cancer a few years ago & he wants to make sure he is still clear of it  Please call patient to discuss [phone]

## 2021-11-28 NOTE — Telephone Encounter (Signed)
Well I think he mentioned that, but I dont normally order that for the purpose of re-staging for his history of hodgins lymphoma  I did refer to Oncology and I am sure they would order that when he is seen

## 2021-11-28 NOTE — Assessment & Plan Note (Signed)
BP Readings from Last 3 Encounters:  11/25/21 130/80  09/06/21 (!) 145/93  03/26/21 129/70   Stable, pt to continue medical treatment norvasc 5 qd

## 2021-11-28 NOTE — Assessment & Plan Note (Signed)
Last vitamin D Lab Results  Component Value Date   VD25OH 19.24 (L) 11/25/2021   Low, to start oral replacement

## 2021-11-28 NOTE — Assessment & Plan Note (Signed)
Has been lost to f/u for several years, asks for f/u referral,  to f/u any worsening symptoms or concerns

## 2021-11-28 NOTE — Assessment & Plan Note (Signed)
New recent worsening , for cialis prn,  to f/u any worsening symptoms or concerns

## 2021-12-12 ENCOUNTER — Telehealth: Payer: Self-pay

## 2021-12-12 ENCOUNTER — Inpatient Hospital Stay: Payer: 59

## 2021-12-12 ENCOUNTER — Inpatient Hospital Stay: Payer: 59 | Admitting: Oncology

## 2021-12-12 MED ORDER — SILDENAFIL CITRATE 100 MG PO TABS: 50.0000 mg | ORAL_TABLET | Freq: Every day | ORAL | 11 refills | Status: DC | PRN

## 2021-12-12 NOTE — Telephone Encounter (Signed)
Pt calling in to stating that the new Rx Cialis isn't effective and would like to try something stronger. Pt stated that the other medication that he was taking before Cialis was Revatio worked better but still would like to try something stronger like Viagra.  LOV 11/25/21

## 2021-12-12 NOTE — Telephone Encounter (Signed)
Ok this is done 

## 2021-12-20 ENCOUNTER — Encounter: Payer: Self-pay | Admitting: *Deleted

## 2021-12-23 ENCOUNTER — Other Ambulatory Visit: Payer: Self-pay

## 2021-12-23 ENCOUNTER — Inpatient Hospital Stay: Payer: 59

## 2021-12-23 ENCOUNTER — Encounter: Payer: Self-pay | Admitting: Oncology

## 2021-12-23 ENCOUNTER — Inpatient Hospital Stay: Payer: 59 | Attending: Oncology | Admitting: Oncology

## 2021-12-23 VITALS — BP 112/95 | HR 79 | Temp 97.4°F | Resp 16 | Ht 75.0 in | Wt 277.0 lb

## 2021-12-23 DIAGNOSIS — Z8571 Personal history of Hodgkin lymphoma: Secondary | ICD-10-CM | POA: Insufficient documentation

## 2021-12-23 DIAGNOSIS — Z8546 Personal history of malignant neoplasm of prostate: Secondary | ICD-10-CM | POA: Diagnosis not present

## 2021-12-23 DIAGNOSIS — Z923 Personal history of irradiation: Secondary | ICD-10-CM | POA: Diagnosis not present

## 2021-12-23 DIAGNOSIS — F17210 Nicotine dependence, cigarettes, uncomplicated: Secondary | ICD-10-CM | POA: Diagnosis not present

## 2021-12-23 DIAGNOSIS — Z9221 Personal history of antineoplastic chemotherapy: Secondary | ICD-10-CM | POA: Insufficient documentation

## 2021-12-23 NOTE — Progress Notes (Signed)
Hematology/Oncology Consult note [facility] Telephone:([phone] [phone]  Patient Care Team: Biagio Borg, MD as PCP - General Sindy Guadeloupe, MD as Consulting Physician (Oncology)   Name of the patient: Lawrence Randolph  [id number]  [birth year]    Reason for referral-history of Hodgkin's lymphoma   Referring physician-Dr. 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  He underwent 4 cycles of ABVD chemotherapy between November 2006 to February 2017 followed by involved field radiation treatment.  He has remained in remission since then.  He wanted to make sure that there is nothing further to be done at this point and therefore has come to reestablish follow-up.  Appetite and weight have remained stable.  Denies any drenching night sweats or lumps or bumps anywhere.  ECOG PS- 0  Pain scale- 0   Review of systems- Review of Systems  Constitutional:  Negative for chills, fever, malaise/fatigue and weight loss.  HENT:  Negative for congestion, ear discharge and nosebleeds.   Eyes:  Negative for blurred vision.  Respiratory:  Negative for cough, hemoptysis, sputum production, shortness of breath and wheezing.   Cardiovascular:  Negative for chest pain, palpitations, orthopnea and claudication.  Gastrointestinal:  Negative for abdominal pain, blood in stool, constipation, diarrhea, heartburn, melena, nausea and vomiting.  Genitourinary:  Negative for dysuria, flank pain, frequency, hematuria and urgency.  Musculoskeletal:  Negative for back pain, joint pain and myalgias.  Skin:  Negative for rash.  Neurological:  Negative for dizziness, tingling, focal weakness, seizures, weakness and headaches.  Endo/Heme/Allergies:  Does not bruise/bleed  easily.  Psychiatric/Behavioral:  Negative for depression and suicidal ideas. The patient does not have insomnia.    No Known Allergies  Patient Active Problem List   Diagnosis Date Noted   Allergic rhinitis 12/13/2020   HTN (hypertension) 11/06/2020   Encounter for well adult exam with abnormal findings 10/09/2020   Acute upper respiratory infection 04/17/2020   Cellulitis of knee 04/17/2020   Vitamin D deficiency 09/17/2019   Hand cramps 09/16/2019   Carpal tunnel syndrome 10/17/2015   Prostatitis 10/12/2013   Hodgkin's disease ([place])    Genital herpes 06/08/2008   HYPOGONADISM, MALE 10/18/2007   HYPERLIPIDEMIA 10/18/2007   Erectile dysfunction 10/11/2007     Past Medical History:  Diagnosis Date   ERECTILE DYSFUNCTION 10/11/2007   Qualifier: Diagnosis of  By: Jenny Reichmann MD, Hunt Oris    GENITAL HERPES 06/08/2008   Qualifier: Diagnosis of  By: Jenny Reichmann MD, Hunt Oris    GERD (gastroesophageal reflux disease)    Hodgkin lymphoma ([place]) 2006   Stage IA nodular sclerosing Hodgkin lymphoma of the right   HYPERLIPIDEMIA 10/18/2007   Qualifier: Diagnosis of  By: Jenny Reichmann MD, Shannan Harper, MALE 10/18/2007   Qualifier: Diagnosis of  By: Jenny Reichmann MD, Hunt Oris      Past Surgical History:  Procedure Laterality Date   FOOT SURGERY Left    NECK SURGERY Right 04/2011   biopsy - stage I Hodgkin's disease.   right VATS  04/2011   for organizing chronic inflammation RUL    Social History   Socioeconomic History   Marital status: Married    Spouse name: Not on file   Number of children: Not on file   Years of education: Not on file  Highest education level: Not on file  Occupational History   Not on file  Tobacco Use   Smoking status: Every Day    Packs/day: 0.25    Years: 15.00    Pack years: 3.75    Types: Cigarettes   Smokeless tobacco: Never  Vaping Use   Vaping Use: Never used  Substance and Sexual Activity   Alcohol use: No   Drug use: No   Sexual activity: Yes  Other  Topics Concern   Not on file  Social History Narrative   Not on file   Social Determinants of Health   Financial Resource Strain: Not on file  Food Insecurity: Not on file  Transportation Needs: Not on file  Physical Activity: Not on file  Stress: Not on file  Social Connections: Not on file  Intimate Partner Violence: Not on file     Family History  Problem Relation Age of Onset   Prostate cancer Father    Hypertension Other    Diabetes Other    Prostate cancer Paternal Grandfather    Prostate cancer Paternal Uncle    Colon cancer Neg Hx    Rectal cancer Neg Hx    Stomach cancer Neg Hx      Current Outpatient Medications:    amLODipine (NORVASC) 5 MG tablet, Take 1 tablet (5 mg total) by mouth daily., Disp: 90 tablet, Rfl: 3   Cholecalciferol 50 MCG (2000 UT) TABS, 1 tab by mouth once daily, Disp: 30 tablet, Rfl: 99   pantoprazole (PROTONIX) 20 MG tablet, Take 1 tablet (20 mg total) by mouth daily., Disp: 90 tablet, Rfl: 3   rosuvastatin (CRESTOR) 20 MG tablet, Take 1 tablet (20 mg total) by mouth daily., Disp: 90 tablet, Rfl: 3   sildenafil (VIAGRA) 100 MG tablet, Take 0.5-1 tablets (50-100 mg total) by mouth daily as needed for erectile dysfunction., Disp: 5 tablet, Rfl: 11   valACYclovir (VALTREX) 1000 MG tablet, Take 1 tablet (1,000 mg total) by mouth 2 (two) times daily. (Patient not taking: Reported on 12/23/2021), Disp: 14 tablet, Rfl: 5   Physical exam:  Vitals:   12/23/21 1100  BP: (!) 112/95  Pulse: 79  Resp: 16  Temp: (!) 97.4 F (36.3 C)  TempSrc: Tympanic  SpO2: 99%  Weight: 277 lb (125.6 kg)  Height: 6\' 3"  (1.905 m)   Physical Exam Cardiovascular:     Rate and Rhythm: Normal rate and regular rhythm.     Heart sounds: Normal heart sounds.  Pulmonary:     Effort: Pulmonary effort is normal.     Breath sounds: Normal breath sounds.  Abdominal:     General: Bowel sounds are normal.     Palpations: Abdomen is soft.  Lymphadenopathy:     Comments:  No palpable cervical, supraclavicular, axillary or inguinal adenopathy    Skin:    General: Skin is warm and dry.  Neurological:     Mental Status: He is alert and oriented to person, place, and time.       CMP Latest Ref Rng & Units 11/25/2021  Glucose 70 - 99 mg/dL 91  BUN 6 - 23 mg/dL 15  Creatinine 0.40 - 1.50 mg/dL 1.11  Sodium 135 - 145 mEq/L 142  Potassium 3.5 - 5.1 mEq/L 4.4  Chloride 96 - 112 mEq/L 105  CO2 19 - 32 mEq/L 31  Calcium 8.4 - 10.5 mg/dL 9.8  Total Protein 6.0 - 8.3 g/dL 7.1  Total Bilirubin 0.2 - 1.2 mg/dL 0.5  Alkaline  Phos 39 - 117 U/L 88  AST 0 - 37 U/L 23  ALT 0 - 53 U/L 40   CBC Latest Ref Rng & Units 11/25/2021  WBC 4.0 - 10.5 K/uL 7.3  Hemoglobin 13.0 - 17.0 g/dL 14.0  Hematocrit 39.0 - 52.0 % 42.9  Platelets 150.0 - 400.0 K/uL 210.0   Assessment and plan- Patient is a 51 y.o. male with history of Hodgkin's lymphoma diagnosed in 2006 here to establish follow-up  Patient's recent labs including CBC and LFTs were normal.  Clinically patient is doing well with no concerning signs and symptoms of recurrence based on today's exam.No B symptoms.  Has been over 15 years since patient's diagnosis of Hodgkin's lymphoma and no surveillance imaging is indicated.  It would be reasonable to check annual TSH given that he received radiation treatment to his neck.  His recent TSH from 11/25/2021 was within normal limits.  No further follow-up needed with oncology at this time   Thank you for this kind referral and the opportunity to participate in the care of this patient   Visit Diagnosis 1. History of Hodgkin's lymphoma     Dr. Randa Evens, MD, MPH [facility] [phone] 12/23/2021

## 2022-01-21 ENCOUNTER — Ambulatory Visit
Admission: EM | Admit: 2022-01-21 | Discharge: 2022-01-21 | Disposition: A | Discharge status: Home / Self Care | Payer: 59 | Attending: Emergency Medicine | Admitting: Emergency Medicine

## 2022-01-21 ENCOUNTER — Other Ambulatory Visit: Payer: Self-pay

## 2022-01-21 ENCOUNTER — Encounter: Payer: Self-pay | Admitting: Emergency Medicine

## 2022-01-21 DIAGNOSIS — M71021 Abscess of bursa, right elbow: Secondary | ICD-10-CM | POA: Diagnosis not present

## 2022-01-21 DIAGNOSIS — M7021 Olecranon bursitis, right elbow: Secondary | ICD-10-CM

## 2022-01-21 MED ORDER — NAPROXEN 500 MG PO TABS: 500.0000 mg | ORAL_TABLET | Freq: Two times a day (BID) | ORAL | 0 refills | Status: AC

## 2022-01-21 NOTE — Discharge Instructions (Addendum)
Protect your elbow from injury.  Take the naproxen as directed.  Follow up with an orthopedist such as the one listed below.

## 2022-01-21 NOTE — ED Triage Notes (Signed)
Pt here with swelling of right elbow since yesterday. Minimal pain. No mechanism of injury.

## 2022-01-21 NOTE — ED Provider Notes (Signed)
Lawrence Randolph    CSN: [id number] Arrival date & time: 01/21/22  1194      History   Chief Complaint Chief Complaint  Patient presents with   Joint Swelling    HPI Lawrence Randolph is a 51 y.o. male.  Patient presents with swelling of his right elbow since yesterday.  No falls or injury.  He reports mild pain, currently 3/10.  No wounds, redness, bruising, numbness, weakness, paresthesias, fever, chills, or other symptoms.  No treatments at home.  His medical history includes hypertension, Hodgkin's lymphoma, GERD.   The history is provided by the patient and medical records.   Past Medical History:  Diagnosis Date   ERECTILE DYSFUNCTION 10/11/2007   Qualifier: Diagnosis of  By: Jenny Reichmann MD, [place] 06/08/2008   Qualifier: Diagnosis of  By: Jenny Reichmann MD, Hunt Oris    GERD (gastroesophageal reflux disease)    Hodgkin lymphoma ([place]) 2006   Stage IA nodular sclerosing Hodgkin lymphoma of the right   HYPERLIPIDEMIA 10/18/2007   Qualifier: Diagnosis of  By: Jenny Reichmann MD, Shannan Harper, MALE 10/18/2007   Qualifier: Diagnosis of  By: Jenny Reichmann MD, Hunt Oris     Patient Active Problem List   Diagnosis Date Noted   Allergic rhinitis 12/13/2020   HTN (hypertension) 11/06/2020   Encounter for well adult exam with abnormal findings 10/09/2020   Acute upper respiratory infection 04/17/2020   Cellulitis of knee 04/17/2020   Vitamin D deficiency 09/17/2019   Hand cramps 09/16/2019   Carpal tunnel syndrome 10/17/2015   Prostatitis 10/12/2013   Hodgkin's disease ([place])    Genital herpes 06/08/2008   HYPOGONADISM, MALE 10/18/2007   HYPERLIPIDEMIA 10/18/2007   Erectile dysfunction 10/11/2007    Past Surgical History:  Procedure Laterality Date   FOOT SURGERY Left    NECK SURGERY Right 04/2011   biopsy - stage I Hodgkin's disease.   right VATS  04/2011   for organizing chronic inflammation RUL       Home Medications    Prior to Admission medications   Medication  Sig Start Date End Date Taking? Authorizing Provider  naproxen (NAPROSYN) 500 MG tablet Take 1 tablet (500 mg total) by mouth 2 (two) times daily. 01/21/22  Yes Sharion Balloon, NP  amLODipine (NORVASC) 5 MG tablet Take 1 tablet (5 mg total) by mouth daily. 11/25/21   Biagio Borg, MD  Cholecalciferol 50 MCG (2000 UT) TABS 1 tab by mouth once daily 11/25/21   Biagio Borg, MD  pantoprazole (PROTONIX) 20 MG tablet Take 1 tablet (20 mg total) by mouth daily. 11/25/21   Biagio Borg, MD  rosuvastatin (CRESTOR) 20 MG tablet Take 1 tablet (20 mg total) by mouth daily. 11/25/21   Biagio Borg, MD  sildenafil (VIAGRA) 100 MG tablet Take 0.5-1 tablets (50-100 mg total) by mouth daily as needed for erectile dysfunction. 12/12/21   Biagio Borg, MD  valACYclovir (VALTREX) 1000 MG tablet Take 1 tablet (1,000 mg total) by mouth 2 (two) times daily. Patient not taking: Reported on 12/23/2021 11/25/21   Biagio Borg, MD    Family History Family History  Problem Relation Age of Onset   Prostate cancer Father    Hypertension Other    Diabetes Other    Prostate cancer Paternal Grandfather    Prostate cancer Paternal Uncle    Colon cancer Neg Hx    Rectal cancer Neg Hx    Stomach cancer  Neg Hx     Social History Social History   Tobacco Use   Smoking status: Every Day    Packs/day: 0.25    Years: 15.00    Pack years: 3.75    Types: Cigarettes   Smokeless tobacco: Never  Vaping Use   Vaping Use: Never used  Substance Use Topics   Alcohol use: No   Drug use: No     Allergies   Patient has no known allergies.   Review of Systems Review of Systems  Constitutional:  Negative for chills and fever.  Musculoskeletal:  Positive for arthralgias and joint swelling.  Skin:  Negative for color change, rash and wound.  Neurological:  Negative for weakness and numbness.  All other systems reviewed and are negative.   Physical Exam Triage Vital Signs ED Triage Vitals  Enc Vitals Group     BP       Pulse      Resp      Temp      Temp src      SpO2      Weight      Height      Head Circumference      Peak Flow      Pain Score      Pain Loc      Pain Edu?      Excl. in [place]?    No data found.  Updated Vital Signs BP 120/78    Pulse 68    Temp 98.1 F (36.7 C)    Resp 18    SpO2 97%   Visual Acuity Right Eye Distance:   Left Eye Distance:   Bilateral Distance:    Right Eye Near:   Left Eye Near:    Bilateral Near:     Physical Exam Vitals and nursing note reviewed.  Constitutional:      General: He is not in acute distress.    Appearance: He is well-developed. He is not ill-appearing.  HENT:     Mouth/Throat:     Mouth: Mucous membranes are moist.  Cardiovascular:     Rate and Rhythm: Normal rate and regular rhythm.     Heart sounds: Normal heart sounds.  Pulmonary:     Effort: Pulmonary effort is normal. No respiratory distress.     Breath sounds: Normal breath sounds.  Musculoskeletal:        General: Swelling and tenderness present. No deformity. Normal range of motion.       Arms:     Cervical back: Neck supple.     Comments: Soft, mildly tender, swelling of right elbow.  See diagram.   Skin:    General: Skin is warm and dry.     Capillary Refill: Capillary refill takes less than 2 seconds.     Findings: No bruising, erythema, lesion or rash.  Neurological:     General: No focal deficit present.     Mental Status: He is alert and oriented to person, place, and time.  Psychiatric:        Mood and Affect: Mood normal.        Behavior: Behavior normal.     UC Treatments / Results  Labs (all labs ordered are listed, but only abnormal results are displayed) Labs Reviewed - No data to display  EKG   Radiology No results found.  Procedures Procedures (including critical care time)  Medications Ordered in UC Medications - No data to display  Initial Impression / Assessment and  Plan / UC Course  I have reviewed the triage vital signs  and the nursing notes.  Pertinent labs & imaging results that were available during my care of the patient were reviewed by me and considered in my medical decision making (see chart for details).  Right olecranon bursitis.  Treating with naproxen.  Elbow wrapped with padding and Ace wrap; sling applied also.  Instructed patient to protect his joint from injury and take the naproxen as directed.  Instructed him to follow-up with orthopedics and contact information provided for on-call orthopedist.  Patient agrees to plan of care.   Final Clinical Impressions(s) / UC Diagnoses   Final diagnoses:  Olecranon bursitis of right elbow     Discharge Instructions      Protect your elbow from injury.  Take the naproxen as directed.  Follow up with an orthopedist such as the one listed below.         ED Prescriptions     Medication Sig Dispense Auth. Provider   naproxen (NAPROSYN) 500 MG tablet Take 1 tablet (500 mg total) by mouth 2 (two) times daily. 28 tablet Sharion Balloon, NP      PDMP not reviewed this encounter.   Sharion Balloon, NP 01/21/22 [phone]

## 2022-11-25 ENCOUNTER — Ambulatory Visit: Payer: 59 | Admitting: Internal Medicine

## 2022-11-26 ENCOUNTER — Ambulatory Visit (INDEPENDENT_AMBULATORY_CARE_PROVIDER_SITE_OTHER): Payer: 59 | Admitting: Internal Medicine

## 2022-11-26 VITALS — BP 130/80 | HR 70 | Temp 98.9°F | Ht 75.0 in | Wt 278.0 lb

## 2022-11-26 DIAGNOSIS — R739 Hyperglycemia, unspecified: Secondary | ICD-10-CM | POA: Diagnosis not present

## 2022-11-26 DIAGNOSIS — Z125 Encounter for screening for malignant neoplasm of prostate: Secondary | ICD-10-CM | POA: Diagnosis not present

## 2022-11-26 DIAGNOSIS — Z0001 Encounter for general adult medical examination with abnormal findings: Secondary | ICD-10-CM

## 2022-11-26 DIAGNOSIS — E559 Vitamin D deficiency, unspecified: Secondary | ICD-10-CM | POA: Diagnosis not present

## 2022-11-26 DIAGNOSIS — E538 Deficiency of other specified B group vitamins: Secondary | ICD-10-CM | POA: Diagnosis not present

## 2022-11-26 DIAGNOSIS — E782 Mixed hyperlipidemia: Secondary | ICD-10-CM

## 2022-11-26 DIAGNOSIS — F17200 Nicotine dependence, unspecified, uncomplicated: Secondary | ICD-10-CM

## 2022-11-26 DIAGNOSIS — I10 Essential (primary) hypertension: Secondary | ICD-10-CM

## 2022-11-26 LAB — CBC WITH DIFFERENTIAL/PLATELET
Basophils Absolute: 0 10*3/uL (ref 0.0–0.1)
Basophils Relative: 0.8 % (ref 0.0–3.0)
Eosinophils Absolute: 0.1 10*3/uL (ref 0.0–0.7)
Eosinophils Relative: 1.3 % (ref 0.0–5.0)
HCT: 46 % (ref 39.0–52.0)
Hemoglobin: 15.3 g/dL (ref 13.0–17.0)
Lymphocytes Relative: 33.5 % (ref 12.0–46.0)
Lymphs Abs: 2.1 10*3/uL (ref 0.7–4.0)
MCHC: 33.3 g/dL (ref 30.0–36.0)
MCV: 88.2 fl (ref 78.0–100.0)
Monocytes Absolute: 0.5 10*3/uL (ref 0.1–1.0)
Monocytes Relative: 8 % (ref 3.0–12.0)
Neutro Abs: 3.5 10*3/uL (ref 1.4–7.7)
Neutrophils Relative %: 56.4 % (ref 43.0–77.0)
Platelets: 222 10*3/uL (ref 150.0–400.0)
RBC: 5.22 Mil/uL (ref 4.22–5.81)
RDW: 14.7 % (ref 11.5–15.5)
WBC: 6.2 10*3/uL (ref 4.0–10.5)

## 2022-11-26 LAB — BASIC METABOLIC PANEL
BUN: 20 mg/dL (ref 6–23)
CO2: 30 mEq/L (ref 19–32)
Calcium: 9.9 mg/dL (ref 8.4–10.5)
Chloride: 103 mEq/L (ref 96–112)
Creatinine, Ser: 1 mg/dL (ref 0.40–1.50)
GFR: 85.99 mL/min (ref >60.00)
Glucose, Bld: 90 mg/dL (ref 70–99)
Potassium: 4.5 mEq/L (ref 3.5–5.1)
Sodium: 141 mEq/L (ref 135–145)

## 2022-11-26 LAB — HEPATIC FUNCTION PANEL
ALT: 52 U/L (ref 0–53)
AST: 32 U/L (ref 0–37)
Albumin: 4.9 g/dL (ref 3.5–5.2)
Alkaline Phosphatase: 84 U/L (ref 39–117)
Bilirubin, Direct: 0.1 mg/dL (ref 0.0–0.3)
Total Bilirubin: 0.5 mg/dL (ref 0.2–1.2)
Total Protein: 7.4 g/dL (ref 6.0–8.3)

## 2022-11-26 LAB — URINALYSIS, ROUTINE W REFLEX MICROSCOPIC
Bilirubin Urine: NEGATIVE
Hgb urine dipstick: NEGATIVE
Ketones, ur: NEGATIVE
Leukocytes,Ua: NEGATIVE
Nitrite: NEGATIVE
RBC / HPF: NONE SEEN (ref 0–?)
Specific Gravity, Urine: 1.025 (ref 1.000–1.030)
Total Protein, Urine: NEGATIVE
Urine Glucose: NEGATIVE
Urobilinogen, UA: 0.2 (ref 0.0–1.0)
pH: 6 (ref 5.0–8.0)

## 2022-11-26 LAB — VITAMIN D 25 HYDROXY (VIT D DEFICIENCY, FRACTURES): VITD: 44.01 ng/mL (ref 30.00–100.00)

## 2022-11-26 LAB — LIPID PANEL
Cholesterol: 153 mg/dL (ref 0–200)
HDL: 56.9 mg/dL (ref >39.00)
LDL Cholesterol: 85 mg/dL (ref 0–99)
NonHDL: 95.78
Total CHOL/HDL Ratio: 3
Triglycerides: 55 mg/dL (ref 0.0–149.0)
VLDL: 11 mg/dL (ref 0.0–40.0)

## 2022-11-26 LAB — TSH: TSH: 1.57 u[IU]/mL (ref 0.35–5.50)

## 2022-11-26 LAB — PSA: PSA: 1.2 ng/mL (ref 0.10–4.00)

## 2022-11-26 LAB — VITAMIN B12: Vitamin B-12: 285 pg/mL (ref 211–911)

## 2022-11-26 LAB — HEMOGLOBIN A1C: Hgb A1c MFr Bld: 6.3 % (ref 4.6–6.5)

## 2022-11-26 MED ORDER — ROSUVASTATIN CALCIUM 20 MG PO TABS
20.0000 mg | ORAL_TABLET | Freq: Every day | ORAL | 3 refills | Status: DC
Start: 2022-11-26 — End: 2023-11-09

## 2022-11-26 NOTE — Assessment & Plan Note (Signed)
Last vitamin D Lab Results  Component Value Date   VD25OH 19.24 (L) 11/25/2021   Low, pt currently taking 2000 u qd oral replacement, for f/u lab

## 2022-11-26 NOTE — Patient Instructions (Addendum)
Please have your Shingrix (shingles) shots done at your local pharmacy.  Please continue all other medications as before, and refills have been done if requested.  Please have the pharmacy call with any other refills you may need.  Please continue your efforts at being more active, low cholesterol diet, and weight control.  You are otherwise up to date with prevention measures today.  Please keep your appointments with your specialists as you may have planned  Please call if you would want that Cardiac CT score testing  Please go to the LAB at the blood drawing area for the tests to be done  You will be contacted by phone if any changes need to be made immediately.  Otherwise, you will receive a letter about your results with an explanation, but please check with MyChart first.  Please remember to sign up for MyChart if you have not done so, as this will be important to you in the future with finding out test results, communicating by private email, and scheduling acute appointments online when needed.  Please make an Appointment to return for your 1 year visit, or sooner if needed, such as you mentioned regarding going over your test results in person.

## 2022-11-26 NOTE — Progress Notes (Signed)
Patient ID: Lawrence Randolph, male   DOB: 1971/06/12, 51 y.o.   MRN: [id number]         Chief Complaint:: wellness exam and low vit d, hld, smoker       HPI:  Lawrence Randolph is a 51 y.o. male here for wellness exam; declines covid booster, plans for shingrx at pharmacy, o/w up to date                        Also Pt denies chest pain, increased sob or doe, wheezing, orthopnea, PND, increased LE swelling, palpitations, dizziness or syncope.   Pt denies polydipsia, polyuria, or new focal neuro s/s.    Pt denies fever, wt loss, night sweats, loss of appetite, or other constitutional symptoms  Not taking Vit D  Feels he can do better with lower chol diet.  Still smoking, not ready to quit   Wt Readings from Last 3 Encounters:  11/26/22 278 lb (126.1 kg)  12/23/21 277 lb (125.6 kg)  11/25/21 278 lb 3.2 oz (126.2 kg)   BP Readings from Last 3 Encounters:  11/26/22 130/80  01/21/22 120/78  12/23/21 (!) 112/95   Immunization History  Administered Date(s) Administered   Influenza Split 09/21/2013, 09/14/2014   Influenza,inj,Quad PF,6+ Mos 09/16/2019   Influenza-Unspecified 10/14/2015, 09/25/2020, 10/28/2021, 11/19/2022   PFIZER(Purple Top)SARS-COV-2 Vaccination 04/24/2020, 05/15/2020, 10/28/2021   PNEUMOCOCCAL CONJUGATE-20 11/25/2021   Tdap 10/09/2020  There are no preventive care reminders to display for this patient.    Past Medical History:  Diagnosis Date   ERECTILE DYSFUNCTION 10/11/2007   Qualifier: Diagnosis of  By: Jenny Reichmann MD, [place] 06/08/2008   Qualifier: Diagnosis of  By: Jenny Reichmann MD, Hunt Oris    GERD (gastroesophageal reflux disease)    Hodgkin lymphoma ([place]) 2006   Stage IA nodular sclerosing Hodgkin lymphoma of the right   HYPERLIPIDEMIA 10/18/2007   Qualifier: Diagnosis of  By: Jenny Reichmann MD, Shannan Harper, MALE 10/18/2007   Qualifier: Diagnosis of  By: Jenny Reichmann MD, Hunt Oris    Past Surgical History:  Procedure Laterality Date   FOOT SURGERY Left    NECK SURGERY  Right 04/2011   biopsy - stage I Hodgkin's disease.   right VATS  04/2011   for organizing chronic inflammation RUL    reports that he has been smoking cigarettes. He has a 3.75 pack-year smoking history. He has never used smokeless tobacco. He reports that he does not drink alcohol and does not use drugs. family history includes Diabetes in an other family member; Hypertension in an other family member; Prostate cancer in his father, paternal grandfather, and paternal uncle. No Known Allergies Current Outpatient Medications on File Prior to Visit  Medication Sig Dispense Refill   amLODipine (NORVASC) 5 MG tablet Take 1 tablet (5 mg total) by mouth daily. 90 tablet 3   Cholecalciferol 50 MCG (2000 UT) TABS 1 tab by mouth once daily 30 tablet 99   naproxen (NAPROSYN) 500 MG tablet Take 1 tablet (500 mg total) by mouth 2 (two) times daily. 28 tablet 0   pantoprazole (PROTONIX) 20 MG tablet Take 1 tablet (20 mg total) by mouth daily. 90 tablet 3   sildenafil (VIAGRA) 100 MG tablet Take 0.5-1 tablets (50-100 mg total) by mouth daily as needed for erectile dysfunction. 5 tablet 11   tadalafil (CIALIS) 20 MG tablet SMARTSIG:0.5-1 Tablet(s) By Mouth Every Other Day PRN  valACYclovir (VALTREX) 1000 MG tablet Take 1 tablet (1,000 mg total) by mouth 2 (two) times daily. 14 tablet 5   vardenafil (LEVITRA) 20 MG tablet Take 1 tablet by mouth daily as needed.     No current facility-administered medications on file prior to visit.        ROS:  All others reviewed and negative.  Objective        PE:  BP 130/80 (BP Location: Right Arm, Patient Position: Sitting, Cuff Size: Large)   Pulse 70   Temp 98.9 F (37.2 C) (Oral)   Ht '6\' 3"'$  (1.905 m)   Wt 278 lb (126.1 kg)   SpO2 93%   BMI 34.75 kg/m                 Constitutional: Pt appears in NAD               HENT: Head: NCAT.                Right Ear: External ear normal.                 Left Ear: External ear normal.                Eyes: .  Pupils are equal, round, and reactive to light. Conjunctivae and EOM are normal               Nose: without d/c or deformity               Neck: Neck supple. Gross normal ROM               Cardiovascular: Normal rate and regular rhythm.                 Pulmonary/Chest: Effort normal and breath sounds without rales or wheezing.                Abd:  Soft, NT, ND, + BS, no organomegaly               Neurological: Pt is alert. At baseline orientation, motor grossly intact               Skin: Skin is warm. No rashes, no other new lesions, LE edema - none               Psychiatric: Pt behavior is normal without agitation   Micro: none  Cardiac tracings I have personally interpreted today:  none  Pertinent Radiological findings (summarize): none   Lab Results  Component Value Date   WBC 6.2 11/26/2022   HGB 15.3 11/26/2022   HCT 46.0 11/26/2022   PLT 222.0 11/26/2022   GLUCOSE 90 11/26/2022   CHOL 153 11/26/2022   TRIG 55.0 11/26/2022   HDL 56.90 11/26/2022   LDLDIRECT 168.0 09/09/2018   LDLCALC 85 11/26/2022   ALT 52 11/26/2022   AST 32 11/26/2022   NA 141 11/26/2022   K 4.5 11/26/2022   CL 103 11/26/2022   CREATININE 1.00 11/26/2022   BUN 20 11/26/2022   CO2 30 11/26/2022   TSH 1.57 11/26/2022   PSA 1.20 11/26/2022   HGBA1C 6.3 11/26/2022   Assessment/Plan:  Lawrence Randolph is a 51 y.o. Black or African American [2] male with  has a past medical history of ERECTILE DYSFUNCTION (10/11/2007), GENITAL HERPES (06/08/2008), GERD (gastroesophageal reflux disease), Hodgkin lymphoma ([place]) (2006), HYPERLIPIDEMIA (10/18/2007), and HYPOGONADISM, MALE (10/18/2007).  Vitamin D deficiency Last vitamin D Lab Results  Component Value Date   VD25OH 19.24 (L) 11/25/2021   Low, pt currently taking 2000 u qd oral replacement, for f/u lab   Encounter for well adult exam with abnormal findings Age and sex appropriate education and counseling updated with regular exercise and diet Referrals  for preventative services - none needed Immunizations addressed - declines covid booster,for shingrix at pharmacy  Smoking counseling  - none needed Evidence for depression or other mood disorder - none significant Most recent labs reviewed. I have personally reviewed and have noted: 1) the patient's medical and social history 2) The patient's current medications and supplements 3) The patient's height, weight, and BMI have been recorded in the chart   HTN (hypertension) BP Readings from Last 3 Encounters:  11/26/22 130/80  01/21/22 120/78  12/23/21 (!) 112/95   Stable, pt to continue medical treatment norvasc 5 mg qd   HYPERLIPIDEMIA Lab Results  Component Value Date   LDLCALC 85 11/26/2022   Stable, pt to continue current statin crestor 20 mg qd, declines Card CT score for now   Smoker Pt counsled to quit, pt not ready  Followup: Return in about 1 year (around 11/27/2023).  Cathlean Cower, MD 11/29/2022 7:16 AM [facility]

## 2022-11-29 ENCOUNTER — Encounter: Payer: Self-pay | Admitting: Internal Medicine

## 2022-11-29 DIAGNOSIS — F17200 Nicotine dependence, unspecified, uncomplicated: Secondary | ICD-10-CM | POA: Insufficient documentation

## 2022-11-29 NOTE — Assessment & Plan Note (Signed)
Pt counsled to quit, pt not ready °

## 2022-11-29 NOTE — Assessment & Plan Note (Signed)
Age and sex appropriate education and counseling updated with regular exercise and diet Referrals for preventative services - none needed Immunizations addressed - declines covid booster, for shingrix at pharmacy Smoking counseling  - none needed Evidence for depression or other mood disorder - none significant Most recent labs reviewed. I have personally reviewed and have noted: 1) the patient's medical and social history 2) The patient's current medications and supplements 3) The patient's height, weight, and BMI have been recorded in the chart  

## 2022-11-29 NOTE — Assessment & Plan Note (Signed)
BP Readings from Last 3 Encounters:  11/26/22 130/80  01/21/22 120/78  12/23/21 (!) 112/95   Stable, pt to continue medical treatment norvasc 5 mg qd

## 2022-11-29 NOTE — Assessment & Plan Note (Signed)
Lab Results  Component Value Date   LDLCALC 85 11/26/2022   Stable, pt to continue current statin crestor 20 mg qd, declines Card CT score for now

## 2022-11-30 ENCOUNTER — Other Ambulatory Visit: Payer: Self-pay | Admitting: Internal Medicine

## 2022-11-30 NOTE — Telephone Encounter (Signed)
Please refill as per office routine med refill policy (all routine meds to be refilled for 3 mo or monthly (per pt preference) up to one year from last visit, then month to month grace period for 3 mo, then further med refills will have to be denied) ? ?

## 2022-12-19 ENCOUNTER — Other Ambulatory Visit: Payer: Self-pay | Admitting: Internal Medicine

## 2022-12-19 NOTE — Telephone Encounter (Signed)
Please refill as per office routine med refill policy (all routine meds to be refilled for 3 mo or monthly (per pt preference) up to one year from last visit, then month to month grace period for 3 mo, then further med refills will have to be denied) ? ?

## 2022-12-23 ENCOUNTER — Other Ambulatory Visit: Payer: Self-pay | Admitting: Internal Medicine

## 2022-12-23 NOTE — Telephone Encounter (Signed)
Please refill as per office routine med refill policy (all routine meds to be refilled for 3 mo or monthly (per pt preference) up to one year from last visit, then month to month grace period for 3 mo, then further med refills will have to be denied) ? ?

## 2023-01-18 ENCOUNTER — Other Ambulatory Visit: Payer: Self-pay | Admitting: Internal Medicine

## 2023-01-18 NOTE — Telephone Encounter (Signed)
Please refill as per office routine med refill policy (all routine meds to be refilled for 3 mo or monthly (per pt preference) up to one year from last visit, then month to month grace period for 3 mo, then further med refills will have to be denied) ? ?

## 2023-02-15 ENCOUNTER — Other Ambulatory Visit: Payer: Self-pay | Admitting: Internal Medicine

## 2023-02-15 NOTE — Telephone Encounter (Signed)
Please refill as per office routine med refill policy (all routine meds to be refilled for 3 mo or monthly (per pt preference) up to one year from last visit, then month to month grace period for 3 mo, then further med refills will have to be denied) ? ?

## 2023-02-25 ENCOUNTER — Other Ambulatory Visit: Payer: Self-pay | Admitting: Internal Medicine

## 2023-02-25 ENCOUNTER — Telehealth: Payer: Self-pay | Admitting: Internal Medicine

## 2023-02-25 MED ORDER — TADALAFIL 20 MG PO TABS: ORAL_TABLET | ORAL | 2 refills | Status: DC

## 2023-02-25 NOTE — Telephone Encounter (Signed)
Prescription Request  02/25/2023  LOV: 11/26/2022  What is the name of the medication or equipment?  tadalafil (CIALIS) 20 MG tablet  Have you contacted your pharmacy to request a refill? Yes   Which pharmacy would you like this sent to?  [place], [place] [address] Phone: [phone] Fax: [phone]    Patient notified that their request is being sent to the clinical staff for review and that they should receive a response within 2 business days.   Please advise at Mobile [phone] (mobile)

## 2023-02-25 NOTE — Telephone Encounter (Signed)
Sent refill to [facility].../[facility] 

## 2023-03-23 ENCOUNTER — Other Ambulatory Visit: Payer: Self-pay | Admitting: Internal Medicine

## 2023-03-24 ENCOUNTER — Ambulatory Visit
Admission: EM | Admit: 2023-03-24 | Discharge: 2023-03-24 | Disposition: A | Discharge status: Home / Self Care | Payer: 59

## 2023-03-24 DIAGNOSIS — R10814 Left lower quadrant abdominal tenderness: Secondary | ICD-10-CM

## 2023-03-24 NOTE — ED Provider Notes (Signed)
Renaldo Fiddler    CSN: [id number] Arrival date & time: 03/24/23  1632      History   Chief Complaint Chief Complaint  Patient presents with   Abdominal Pain    HPI PASTOR Lawrence Randolph is a 52 y.o. male.    Abdominal Pain   Patient presents to urgent care with complaint of abdominal pain on his lower left side starting last night.  He endorses 1 episode of vomiting today mid-afternoon after eating a fish sandwich at a [employer]. He states he ate a "[place] [employer]" sub for dinner yesterday evening and no symptoms for several hours after.  Normal BM this morning. Denies diarrhea. Denies hematochezia.    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   HYPERLIPIDEMIA 10/18/2007   Qualifier: Diagnosis of  By: Jonny Ruiz MD, Rich Number, MALE 10/18/2007   Qualifier: Diagnosis of  By: Jonny Ruiz MD, Len Blalock     Patient Active Problem List   Diagnosis Date Noted   Smoker 11/29/2022   Allergic rhinitis 12/13/2020   HTN (hypertension) 11/06/2020   Encounter for well adult exam with abnormal findings 10/09/2020   Acute upper respiratory infection 04/17/2020   Cellulitis of knee 04/17/2020   Vitamin D deficiency 09/17/2019   Hand cramps 09/16/2019   Carpal tunnel syndrome 10/17/2015   Prostatitis 10/12/2013   Hodgkin's disease    Genital herpes 06/08/2008   HYPOGONADISM, MALE 10/18/2007   HYPERLIPIDEMIA 10/18/2007   Erectile dysfunction 10/11/2007    Past Surgical History:  Procedure Laterality Date   FOOT SURGERY Left    NECK SURGERY Right 04/2011   biopsy - stage I Hodgkin's disease.   right VATS  04/2011   for organizing chronic inflammation RUL       Home Medications    Prior  to Admission medications   Medication Sig Start Date End Date Taking? Authorizing Provider  amLODipine (NORVASC) 5 MG tablet Take 1 tablet by mouth once daily 12/23/22  Yes Myrlene Broker, MD  Cholecalciferol 50 MCG (2000 UT) TABS 1 tab by mouth once daily 11/25/21  Yes Corwin Levins, MD  pantoprazole (PROTONIX) 20 MG tablet Take 1 tablet by mouth once daily 03/23/23  Yes Corwin Levins, MD  rosuvastatin (CRESTOR) 20 MG tablet Take 1 tablet (20 mg total) by mouth daily. 11/26/22  Yes Corwin Levins, MD  naproxen (NAPROSYN) 500 MG tablet Take 1 tablet (500 mg total) by mouth 2 (two) times daily. 01/21/22   Mickie Bail, NP  sildenafil (VIAGRA) 100 MG tablet Take 0.5-1 tablets (50-100 mg total) by mouth daily as needed for erectile dysfunction. 12/12/21   Corwin Levins, MD  tadalafil (CIALIS) 20 MG tablet SMARTSIG:0.5-1 Tablet(s) By Mouth Every Other Day PRN 02/25/23   Corwin Levins, MD  valACYclovir (VALTREX) 1000 MG tablet Take 1 tablet (1,000 mg total) by mouth 2 (two) times daily. 11/25/21   Corwin Levins, MD  vardenafil (LEVITRA) 20 MG tablet Take 1 tablet by mouth daily as needed.    [provider]    Family History Family History  Problem Relation Age of Onset   Prostate cancer Father  Hypertension Other    Diabetes Other    Prostate cancer Paternal Grandfather    Prostate cancer Paternal Uncle    Colon cancer Neg Hx    Rectal cancer Neg Hx    Stomach cancer Neg Hx     Social History Social History   Tobacco Use   Smoking status: Every Day    Packs/day: 0.25    Years: 15.00    Additional pack years: 0.00    Total pack years: 3.75    Types: Cigarettes   Smokeless tobacco: Never  Vaping Use   Vaping Use: Never used  Substance Use Topics   Alcohol use: No   Drug use: No     Allergies   Patient has no known allergies.   Review of Systems Review of Systems  Gastrointestinal:  Positive for abdominal pain.     Physical Exam Triage Vital Signs ED  Triage Vitals  Enc Vitals Group     BP 03/24/23 1741 129/84     Pulse Rate 03/24/23 1741 65     Resp 03/24/23 1741 18     Temp 03/24/23 1741 98.8 F (37.1 C)     Temp Source 03/24/23 1741 Oral     SpO2 03/24/23 1741 96 %     Weight --      Height --      Head Circumference --      Peak Flow --      Pain Score 03/24/23 1748 6     Pain Loc --      Pain Edu? --      Excl. in GC? --    No data found.  Updated Vital Signs BP 129/84 (BP Location: Left Arm)   Pulse 65   Temp 98.8 F (37.1 C) (Oral)   Resp 18   SpO2 96%   Visual Acuity Right Eye Distance:   Left Eye Distance:   Bilateral Distance:    Right Eye Near:   Left Eye Near:    Bilateral Near:     Physical Exam Vitals reviewed.  Constitutional:      Appearance: He is well-developed. He is not ill-appearing.  Abdominal:     General: Abdomen is protuberant.     Palpations: Abdomen is soft.     Tenderness: There is abdominal tenderness in the left lower quadrant. There is no guarding or rebound.  Skin:    General: Skin is warm and dry.  Neurological:     General: No focal deficit present.     Mental Status: He is alert and oriented to person, place, and time.  Psychiatric:        Mood and Affect: Mood normal.      UC Treatments / Results  Labs (all labs ordered are listed, but only abnormal results are displayed) Labs Reviewed - No data to display  EKG   Radiology No results found.  Procedures Procedures (including critical care time)  Medications Ordered in UC Medications - No data to display  Initial Impression / Assessment and Plan / UC Course  I have reviewed the triage vital signs and the nursing notes.  Pertinent labs & imaging results that were available during my care of the patient were reviewed by me and considered in my medical decision making (see chart for details).   Abdomen is soft with left lower quadrant tenderness.  No rebound.  No guarding.  Unclear etiology for his  symptoms.  Likely infectious process including diverticulitis versus viral versus foodborne pathogen.  Recommending watch and wait.  Referral to ED should his symptoms worsen for advanced imaging.  Counseled patient on potential for adverse effects with medications prescribed/recommended today, ER and return-to-clinic precautions discussed, patient verbalized understanding and agreement with care plan.   Final Clinical Impressions(s) / UC Diagnoses   Final diagnoses:  None   Discharge Instructions   None    ED Prescriptions   None    PDMP not reviewed this encounter.   Charma Igo, [place] 03/24/23 1811

## 2023-03-24 NOTE — ED Triage Notes (Signed)
Middle abdominal pain that goes to left lower side that started last night, with vomited x1 today. Taking tylenol that did help with the pain.

## 2023-03-24 NOTE — Discharge Instructions (Signed)
Recommend watch and wait.  If symptoms worsen, go to the ED for emergent evaluation with advanced imaging.

## 2023-03-25 ENCOUNTER — Emergency Department (HOSPITAL_COMMUNITY): Payer: 59

## 2023-03-25 ENCOUNTER — Emergency Department (HOSPITAL_COMMUNITY)
Admission: EM | Admit: 2023-03-25 | Discharge: 2023-03-25 | Disposition: A | Discharge status: Home / Self Care | Payer: 59 | Attending: Emergency Medicine | Admitting: Emergency Medicine

## 2023-03-25 ENCOUNTER — Encounter (HOSPITAL_COMMUNITY): Payer: Self-pay

## 2023-03-25 ENCOUNTER — Other Ambulatory Visit: Payer: Self-pay

## 2023-03-25 DIAGNOSIS — Z79899 Other long term (current) drug therapy: Secondary | ICD-10-CM | POA: Insufficient documentation

## 2023-03-25 DIAGNOSIS — F17210 Nicotine dependence, cigarettes, uncomplicated: Secondary | ICD-10-CM | POA: Diagnosis not present

## 2023-03-25 DIAGNOSIS — Z8571 Personal history of Hodgkin lymphoma: Secondary | ICD-10-CM | POA: Insufficient documentation

## 2023-03-25 DIAGNOSIS — I10 Essential (primary) hypertension: Secondary | ICD-10-CM | POA: Diagnosis not present

## 2023-03-25 DIAGNOSIS — R1032 Left lower quadrant pain: Secondary | ICD-10-CM | POA: Diagnosis present

## 2023-03-25 DIAGNOSIS — R109 Unspecified abdominal pain: Secondary | ICD-10-CM

## 2023-03-25 DIAGNOSIS — N200 Calculus of kidney: Secondary | ICD-10-CM | POA: Insufficient documentation

## 2023-03-25 LAB — CBC WITH DIFFERENTIAL/PLATELET
Abs Immature Granulocytes: 0.03 10*3/uL (ref 0.00–0.07)
Basophils Absolute: 0 10*3/uL (ref 0.0–0.1)
Basophils Relative: 0 %
Eosinophils Absolute: 0 10*3/uL (ref 0.0–0.5)
Eosinophils Relative: 0 %
HCT: 43.9 % (ref 39.0–52.0)
Hemoglobin: 14.3 g/dL (ref 13.0–17.0)
Immature Granulocytes: 0 %
Lymphocytes Relative: 23 %
Lymphs Abs: 2.2 10*3/uL (ref 0.7–4.0)
MCH: 29.1 pg (ref 26.0–34.0)
MCHC: 32.6 g/dL (ref 30.0–36.0)
MCV: 89.4 fL (ref 80.0–100.0)
Monocytes Absolute: 1 10*3/uL (ref 0.1–1.0)
Monocytes Relative: 10 %
Neutro Abs: 6.2 10*3/uL (ref 1.7–7.7)
Neutrophils Relative %: 67 %
Platelets: 214 10*3/uL (ref 150–400)
RBC: 4.91 MIL/uL (ref 4.22–5.81)
RDW: 14.4 % (ref 11.5–15.5)
WBC: 9.5 10*3/uL (ref 4.0–10.5)
nRBC: 0 % (ref 0.0–0.2)

## 2023-03-25 LAB — URINALYSIS, ROUTINE W REFLEX MICROSCOPIC
Bilirubin Urine: NEGATIVE
Glucose, UA: NEGATIVE mg/dL
Hgb urine dipstick: NEGATIVE
Ketones, ur: NEGATIVE mg/dL
Leukocytes,Ua: NEGATIVE
Nitrite: NEGATIVE
Protein, ur: 30 mg/dL — AB
Specific Gravity, Urine: 1.021 (ref 1.005–1.030)
pH: 5 (ref 5.0–8.0)

## 2023-03-25 LAB — LIPASE, BLOOD: Lipase: 29 U/L (ref 11–51)

## 2023-03-25 LAB — COMPREHENSIVE METABOLIC PANEL
ALT: 37 U/L (ref 0–44)
AST: 26 U/L (ref 15–41)
Albumin: 4.6 g/dL (ref 3.5–5.0)
Alkaline Phosphatase: 75 U/L (ref 38–126)
Anion gap: 10 (ref 5–15)
BUN: 16 mg/dL (ref 6–20)
CO2: 24 mmol/L (ref 22–32)
Calcium: 9.3 mg/dL (ref 8.9–10.3)
Chloride: 106 mmol/L (ref 98–111)
Creatinine, Ser: 1.06 mg/dL (ref 0.61–1.24)
GFR, Estimated: >60 mL/min (ref >60)
Glucose, Bld: 106 mg/dL — ABNORMAL HIGH (ref 70–99)
Potassium: 3.6 mmol/L (ref 3.5–5.1)
Sodium: 140 mmol/L (ref 135–145)
Total Bilirubin: 1 mg/dL (ref 0.3–1.2)
Total Protein: 7.9 g/dL (ref 6.5–8.1)

## 2023-03-25 MED ORDER — IOHEXOL 300 MG/ML  SOLN
100.0000 mL | Freq: Once | INTRAMUSCULAR | Status: AC | PRN
  Administered 2023-03-25: 100 mL via INTRAVENOUS

## 2023-03-25 MED ORDER — FAMOTIDINE IN NACL 20-0.9 MG/50ML-% IV SOLN
20.0000 mg | Freq: Once | INTRAVENOUS | Status: AC
  Administered 2023-03-25: 20 mg via INTRAVENOUS
  Filled 2023-03-25: qty 50

## 2023-03-25 MED ORDER — KETOROLAC TROMETHAMINE 15 MG/ML IJ SOLN
15.0000 mg | Freq: Once | INTRAMUSCULAR | Status: AC
  Administered 2023-03-25: 15 mg via INTRAVENOUS
  Filled 2023-03-25: qty 1

## 2023-03-25 MED ORDER — SODIUM CHLORIDE 0.9 % IV BOLUS
1000.0000 mL | Freq: Once | INTRAVENOUS | Status: AC
  Administered 2023-03-25: 1000 mL via INTRAVENOUS

## 2023-03-25 MED ORDER — SODIUM CHLORIDE (PF) 0.9 % IJ SOLN
INTRAMUSCULAR | Status: AC
  Filled 2023-03-25: qty 50

## 2023-03-25 MED ORDER — ONDANSETRON HCL 4 MG/2ML IJ SOLN
4.0000 mg | Freq: Once | INTRAMUSCULAR | Status: AC
  Administered 2023-03-25: 4 mg via INTRAVENOUS
  Filled 2023-03-25: qty 2

## 2023-03-25 NOTE — ED Notes (Signed)
Patient transported to CT 

## 2023-03-25 NOTE — ED Notes (Signed)
Called Lab to add urine culture

## 2023-03-25 NOTE — Discharge Instructions (Addendum)
It was a pleasure caring for you today in the emergency department.  Please return to the emergency department for any worsening or worrisome symptoms.  You may take ibuprofen or acetaminophen every 4-6 hours as needed for discomfort per manufactures instructions on the box.  Increase your water intake over the next few days, recommend lots of rest the next 24 hours.  Follow-up with your PCP

## 2023-03-25 NOTE — ED Triage Notes (Signed)
C/o left sided abd pain with n/v x2 days.  Tender palpitation.  LBM yesterday  Tylenol w/o relief.

## 2023-03-25 NOTE — ED Provider Notes (Signed)
[place] EMERGENCY DEPARTMENT AT [facility] Provider Note  CSN: [id number] Arrival date & time: 03/25/23 4163  Chief Complaint(s) Abdominal Pain  HPI Lawrence Randolph is a 52 y.o. male with past medical history as below, significant for GERD, Hodgkin lymphoma, HLD who presents to the ED with complaint of left lower quad abdominal pain, nausea, vomiting.  Symptom onset 2 to 3 days ago, having intermittent paroxysms of left lower quadrant sharp, stabbing pain associate with nausea and vomiting.  Last episode of emesis was yesterday, has not tried to eat this morning.  No diarrhea, no BRBPR or melena, no change in urination, no testicular or penile discomfort or swelling.  No rashes.  No trauma.  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   HYPERLIPIDEMIA 10/18/2007   Qualifier: Diagnosis of  By: Jonny Ruiz MD, Rich Number, MALE 10/18/2007   Qualifier: Diagnosis of  By: Jonny Ruiz MD, Len Blalock    Patient Active Problem List   Diagnosis Date Noted   Smoker 11/29/2022   Allergic rhinitis 12/13/2020   HTN (hypertension) 11/06/2020   Encounter for well adult exam with abnormal findings 10/09/2020   Acute upper respiratory infection 04/17/2020   Cellulitis of knee 04/17/2020   Vitamin D deficiency 09/17/2019   Hand cramps 09/16/2019   Carpal tunnel syndrome 10/17/2015   Prostatitis 10/12/2013   Hodgkin's disease    Genital herpes 06/08/2008   HYPOGONADISM, MALE 10/18/2007   HYPERLIPIDEMIA 10/18/2007   Erectile dysfunction 10/11/2007   Home Medication(s) Prior to Admission medications   Medication  Sig Start Date End Date Taking? Authorizing Provider  amLODipine (NORVASC) 5 MG tablet Take 1 tablet by mouth once daily 12/23/22   Myrlene Broker, MD  Cholecalciferol 50 MCG (2000 UT) TABS 1 tab by mouth once daily 11/25/21   Corwin Levins, MD  naproxen (NAPROSYN) 500 MG tablet Take 1 tablet (500 mg total) by mouth 2 (two) times daily. 01/21/22   Mickie Bail, NP  pantoprazole (PROTONIX) 20 MG tablet Take 1 tablet by mouth once daily 03/23/23   Corwin Levins, MD  rosuvastatin (CRESTOR) 20 MG tablet Take 1 tablet (20 mg total) by mouth daily. 11/26/22   Corwin Levins, MD  sildenafil (VIAGRA) 100 MG tablet Take 0.5-1 tablets (50-100 mg total) by mouth daily as needed for erectile dysfunction. 12/12/21   Corwin Levins, MD  tadalafil (CIALIS) 20 MG tablet SMARTSIG:0.5-1 Tablet(s) By Mouth Every Other Day PRN 02/25/23   Corwin Levins, MD  valACYclovir (VALTREX) 1000 MG tablet Take 1 tablet (1,000 mg total) by mouth 2 (two) times daily. 11/25/21   Corwin Levins, MD  vardenafil (LEVITRA) 20 MG tablet Take 1 tablet by mouth daily as needed.    [provider]  Past Surgical History Past Surgical History:  Procedure Laterality Date   FOOT SURGERY Left    NECK SURGERY Right 04/2011   biopsy - stage I Hodgkin's disease.   right VATS  04/2011   for organizing chronic inflammation RUL   Family History Family History  Problem Relation Age of Onset   Prostate cancer Father    Hypertension Other    Diabetes Other    Prostate cancer Paternal Grandfather    Prostate cancer Paternal Uncle    Colon cancer Neg Hx    Rectal cancer Neg Hx    Stomach cancer Neg Hx     Social History Social History   Tobacco Use   Smoking status: Every Day    Packs/day: 0.25    Years: 15.00    Additional pack years: 0.00    Total pack years: 3.75    Types: Cigarettes    Smokeless tobacco: Never  Vaping Use   Vaping Use: Never used  Substance Use Topics   Alcohol use: No   Drug use: No   Allergies Patient has no known allergies.  Review of Systems Review of Systems  Constitutional:  Negative for chills and fever.  HENT:  Negative for facial swelling and trouble swallowing.   Eyes:  Negative for photophobia and visual disturbance.  Respiratory:  Negative for cough and shortness of breath.   Cardiovascular:  Negative for chest pain and palpitations.  Gastrointestinal:  Positive for abdominal pain, nausea and vomiting.  Endocrine: Negative for polydipsia and polyuria.  Genitourinary:  Negative for difficulty urinating and hematuria.  Musculoskeletal:  Negative for gait problem and joint swelling.  Skin:  Negative for pallor and rash.  Neurological:  Negative for syncope and headaches.  Psychiatric/Behavioral:  Negative for agitation and confusion.     Physical Exam Vital Signs  I have reviewed the triage vital signs BP (!) 141/96   Pulse 65   Temp 98.6 F (37 C) (Oral)   Resp 16   Wt 126 kg   SpO2 100%   BMI 34.72 kg/m  Physical Exam Vitals and nursing note reviewed.  Constitutional:      General: He is not in acute distress.    Appearance: He is well-developed.  HENT:     Head: Normocephalic and atraumatic.     Right Ear: External ear normal.     Left Ear: External ear normal.     Mouth/Throat:     Mouth: Mucous membranes are moist.  Eyes:     General: No scleral icterus. Cardiovascular:     Rate and Rhythm: Normal rate and regular rhythm.     Pulses: Normal pulses.     Heart sounds: Normal heart sounds.  Pulmonary:     Effort: Pulmonary effort is normal. No respiratory distress.     Breath sounds: Normal breath sounds.  Abdominal:     Palpations: Abdomen is soft.     Tenderness: There is abdominal tenderness in the suprapubic area and left lower quadrant. There is no right CVA tenderness, left CVA tenderness, guarding or  rebound.  Musculoskeletal:     Right lower leg: No edema.     Left lower leg: No edema.  Skin:    General: Skin is warm and dry.     Capillary Refill: Capillary refill takes less than 2 seconds.  Neurological:     Mental Status: He is alert and oriented to person, place, and time.  Psychiatric:        Mood and Affect:  Mood normal.        Behavior: Behavior normal.     ED Results and Treatments Labs (all labs ordered are listed, but only abnormal results are displayed) Labs Reviewed  COMPREHENSIVE METABOLIC PANEL - Abnormal; Notable for the following components:      Result Value   Glucose, Bld 106 (*)    All other components within normal limits  URINALYSIS, ROUTINE W REFLEX MICROSCOPIC - Abnormal; Notable for the following components:   Protein, ur 30 (*)    Bacteria, UA RARE (*)    All other components within normal limits  URINE CULTURE  CBC WITH DIFFERENTIAL/PLATELET  LIPASE, BLOOD                                                                                                                          Radiology CT ABDOMEN PELVIS W CONTRAST  Result Date: 03/25/2023 CLINICAL DATA:  Two day history of left-sided abdominal pain with nausea and vomiting associated with tenderness to palpation EXAM: CT ABDOMEN AND PELVIS WITH CONTRAST TECHNIQUE: Multidetector CT imaging of the abdomen and pelvis was performed using the standard protocol following bolus administration of intravenous contrast. RADIATION DOSE REDUCTION: This exam was performed according to the departmental dose-optimization program which includes automated exposure control, adjustment of the mA and/or kV according to patient size and/or use of iterative reconstruction technique. CONTRAST:  OMNIPAQUE IOHEXOL 300 MG/ML  SOLN COMPARISON:  CT abdomen and pelvis dated 07/26/2012 FINDINGS: Lower chest: No focal consolidation or pulmonary nodule in the lung bases. No pleural effusion or pneumothorax demonstrated.  Partially imaged heart size is normal. Hepatobiliary: No focal hepatic lesions. No intra or extrahepatic biliary ductal dilation. Normal gallbladder. Pancreas: No focal lesions or main ductal dilation. Spleen: Normal in size without focal abnormality. Adrenals/Urinary Tract: No adrenal nodules. No hydronephrosis. Bilateral nonobstructing stones measuring up to 3 mm left interpolar. Mild asymmetric left perinephric and periureteral stranding. Simple left parapelvic cysts. No specific follow-up imaging recommended. Underdistended urinary bladder with circumferential mural thickening. Stomach/Bowel: Normal appearance of the stomach. No evidence of bowel wall thickening, distention, or inflammatory changes. Normal appendix. Vascular/Lymphatic: Aortic atherosclerosis. No enlarged abdominal or pelvic lymph nodes. Reproductive: Prostate is unremarkable. Other: No free fluid, fluid collection, or free air. Musculoskeletal: No acute or abnormal lytic or blastic osseous lesions. Old fracture at the ninth anterior costochondral junction. Small fat-containing paraumbilical and right inguinal hernias. IMPRESSION: 1. Mild asymmetric left perinephric and periureteral stranding with circumferential mural thickening of the urinary bladder, which may be seen with recently passed stone, less likely ascending infection given absence of infectious findings on concurrent urinalysis. 2. Bilateral nonobstructing renal stones measuring up to 3 mm. 3.  Aortic Atherosclerosis (ICD10-I70.0). Electronically Signed   By: Agustin Cree M.D.   On: 03/25/2023 09:59    Pertinent labs & imaging results that were available during my care of the patient were reviewed by me and considered in my medical decision making (see MDM for details).  Medications Ordered in ED Medications  famotidine (PEPCID) IVPB 20 mg premix (20 mg Intravenous New Bag/Given 03/25/23 0854)  ketorolac (TORADOL) 15 MG/ML injection 15 mg (15 mg Intravenous Given 03/25/23 0854)   sodium chloride 0.9 % bolus 1,000 mL (1,000 mLs Intravenous New Bag/Given 03/25/23 0854)  ondansetron (ZOFRAN) injection 4 mg (4 mg Intravenous Given 03/25/23 0854)  iohexol (OMNIPAQUE) 300 MG/ML solution 100 mL (100 mLs Intravenous Contrast Given 03/25/23 1610)                                                                                                                                     Procedures Procedures  (including critical care time)  Medical Decision Making / ED Course    Medical Decision Making:    AMAURIS DEBOIS is a 52 y.o. male with past medical history as below, significant for GERD, Hodgkin lymphoma, HLD who presents to the ED with complaint of left lower quad abdominal pain, nausea, vomiting.. The complaint involves an extensive differential diagnosis and also carries with it a Mall risk of complications and morbidity.  Serious etiology was considered. Ddx includes but is not limited to: Differential diagnosis includes but is not exclusive to ectopic pregnancy, ovarian cyst, ovarian torsion, acute appendicitis, urinary tract infection, endometriosis, bowel obstruction, hernia, colitis, renal colic, gastroenteritis, volvulus etc.   Complete initial physical exam performed, notably the patient  was no acute distress, resting comfortably, HDS, abdomen not peritoneal.    Reviewed and confirmed nursing documentation for past medical history, family history, social history.  Vital signs reviewed.    Clinical Course as of 03/25/23 1034  Wed Mar 25, 2023  0859 WBC, UA: 0-5 [SG]  0859 Bacteria, UA(!): RARE No dysuria or urgency, hematuria [SG]  0859 Patient reports improvement to his symptoms following intervention [SG]  0859 CBC and CMP were stable, lipase stable. [SG]  1013 CTAP c/w recently passed kidney stone [SG]  1022 "IMPRESSION: 1. Mild asymmetric left perinephric and periureteral stranding with circumferential mural thickening of the urinary bladder, which may be  seen with recently passed stone, less likely ascending infection given absence of infectious findings on concurrent urinalysis. 2. Bilateral nonobstructing renal stones measuring up to 3 mm. 3.  Aortic Atherosclerosis (ICD10-I70.0)." CTAP [SG]  1030 Symptoms resolved on recheck [SG]    Clinical Course User Index [SG] Sloan Leiter, DO   Labs reviewed and are stable, UTI seems less likely given urinalysis findings.  No leukocytosis.  Patient did have vomiting yesterday which is since subsided.  This was in conjunction with his abdominal pain.  Could be secondary to kidney stone.  Less likely pyelonephritis given reassuring urinalysis and laboratory evaluation.  Abdomen is not peritoneal, HDS. Patient is feeling much better on recheck.  Stable for discharge.  Discussed kidney stone diet.  Follow-up PCP.  The patient improved significantly and was discharged in stable condition. Detailed discussions were had with the patient  regarding current findings, and need for close f/u with PCP or on call doctor. The patient has been instructed to return immediately if the symptoms worsen in any way for re-evaluation. Patient verbalized understanding and is in agreement with current care plan. All questions answered prior to discharge.     Additional history obtained: -Additional history obtained from na -External records from outside source obtained and reviewed including: Chart review including previous notes, labs, imaging, consultation notes including primary care documentation, urgent care documentation, prior labs and imaging Last CT abdomen was in 2013 with no recurrence of lymphoma, hepatic steatosis was noted> Dr Dalene Carrowdogwu   Lab Tests: -I ordered, reviewed, and interpreted labs.   The pertinent results include:   Labs Reviewed  COMPREHENSIVE METABOLIC PANEL - Abnormal; Notable for the following components:      Result Value   Glucose, Bld 106 (*)    All other components within normal limits   URINALYSIS, ROUTINE W REFLEX MICROSCOPIC - Abnormal; Notable for the following components:   Protein, ur 30 (*)    Bacteria, UA RARE (*)    All other components within normal limits  URINE CULTURE  CBC WITH DIFFERENTIAL/PLATELET  LIPASE, BLOOD    Notable for UA with bacteria, only 0-5 WBCs, nitrate leukocyte negative.  Infection seems unlikely  EKG   EKG Interpretation  Date/Time:    Ventricular Rate:    PR Interval:    QRS Duration:   QT Interval:    QTC Calculation:   R Axis:     Text Interpretation:           Imaging Studies ordered: I ordered imaging studies including CT abdomen pelvis I independently visualized the following imaging with scope of interpretation limited to determining acute life threatening conditions related to emergency care; findings noted above, significant for as noted above, concern for recently passed kidney stone I independently visualized and interpreted imaging. I agree with the radiologist interpretation   Medicines ordered and prescription drug management: Meds ordered this encounter  Medications   famotidine (PEPCID) IVPB 20 mg premix   ketorolac (TORADOL) 15 MG/ML injection 15 mg   sodium chloride 0.9 % bolus 1,000 mL   ondansetron (ZOFRAN) injection 4 mg   iohexol (OMNIPAQUE) 300 MG/ML solution 100 mL    -I have reviewed the patients home medicines and have made adjustments as needed   Consultations Obtained: na   Cardiac Monitoring: The patient was maintained on a cardiac monitor.  I personally viewed and interpreted the cardiac monitored which showed an underlying rhythm of: SR  Social Determinants of Health:  Diagnosis or treatment significantly limited by social determinants of health: current smoker and obesity   Reevaluation: After the interventions noted above, I reevaluated the patient and found that they have resolved  Co morbidities that complicate the patient evaluation  Past Medical History:  Diagnosis  Date   ERECTILE DYSFUNCTION 10/11/2007   Qualifier: Diagnosis of  By: Jonny RuizJohn MD, Len BlalockJames W    GENITAL HERPES 06/08/2008   Qualifier: Diagnosis of  By: Jonny RuizJohn MD, Len BlalockJames W    GERD (gastroesophageal reflux disease)    Hodgkin lymphoma 2006   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    Final Clinical Impression(s) / ED Diagnoses Final diagnoses:  Abdominal pain, unspecified abdominal location  Kidney stone     This chart was dictated using voice recognition software.  Despite best efforts to proofread,  errors can occur which can change the documentation meaning.    Tanda Rockers A, DO 03/25/23 1034

## 2023-03-26 ENCOUNTER — Telehealth: Payer: Self-pay

## 2023-03-26 LAB — URINE CULTURE: Culture: <10000 — AB

## 2023-03-26 NOTE — Transitions of Care (Post Inpatient/ED Visit) (Signed)
   03/26/2023  Name: Lawrence Randolph MRN: [id number] DOB: 19-Sep-1971  Today's TOC FU Call Status: Today's TOC FU Call Status:: Unsuccessul Call (1st Attempt) Unsuccessful Call (1st Attempt) Date: 03/26/23  Attempted to reach the patient regarding the most recent Inpatient/ED visit.  Follow Up Plan: Additional outreach attempts will be made to reach the patient to complete the Transitions of Care (Post Inpatient/ED visit) call.   Signature Karena Addison, LPN [facility] Nurse Health Advisor Direct Dial [phone]

## 2023-03-27 NOTE — Transitions of Care (Post Inpatient/ED Visit) (Signed)
   03/27/2023  Name: TOME PALLEY MRN: [id number] DOB: 1971/03/28  Today's TOC FU Call Status: Today's TOC FU Call Status:: Successful TOC FU Call Competed Unsuccessful Call (1st Attempt) Date: 03/26/23 [facility] FU Call Complete Date: 03/27/23  Transition Care Management Follow-up Telephone Call Date of Discharge: 03/25/23 Discharge Facility: [facility]) Type of Discharge: Emergency Department Reason for ED Visit: Other: (calculus kidney) How have you been since you were released from the hospital?: Better Any questions or concerns?: No  Items Reviewed: Did you receive and understand the discharge instructions provided?: Yes Medications obtained and verified?: Yes (Medications Reviewed) Any new allergies since your discharge?: No Dietary orders reviewed?: Yes Do you have support at home?: Yes People in Home: spouse  Home Care and Equipment/Supplies: Were Home Health Services Ordered?: NA Any new equipment or medical supplies ordered?: NA  Functional Questionnaire: Do you need assistance with bathing/showering or dressing?: No Do you need assistance with meal preparation?: No Do you need assistance with eating?: No Do you have difficulty maintaining continence: No Do you need assistance with getting out of bed/getting out of a chair/moving?: No Do you have difficulty managing or taking your medications?: No  Follow up appointments reviewed: PCP Follow-up appointment confirmed?: NA Specialist Hospital Follow-up appointment confirmed?: Yes Date of Specialist follow-up appointment?: 04/22/23 Follow-Up Specialty Provider:: Dr Joni Reining Do you need transportation to your follow-up appointment?: No Do you understand care options if your condition(s) worsen?: Yes-patient verbalized understanding    SIGNATURE Karena Addison, LPN [facility] Nurse Health Advisor Direct Dial [phone]

## 2023-04-22 ENCOUNTER — Ambulatory Visit
Admission: EM | Admit: 2023-04-22 | Discharge: 2023-04-22 | Disposition: A | Discharge status: Home / Self Care | Payer: 59 | Attending: Emergency Medicine | Admitting: Emergency Medicine

## 2023-04-22 DIAGNOSIS — H00014 Hordeolum externum left upper eyelid: Secondary | ICD-10-CM | POA: Diagnosis not present

## 2023-04-22 MED ORDER — ERYTHROMYCIN 5 MG/GM OP OINT: TOPICAL_OINTMENT | OPHTHALMIC | 0 refills | Status: AC

## 2023-04-22 NOTE — ED Provider Notes (Signed)
Renaldo Fiddler    CSN: [id number] Arrival date & time: 04/22/23  0808      History   Chief Complaint Chief Complaint  Patient presents with   Eye Problem    HPI Lawrence Randolph is a 52 y.o. male.  Patient presents with left upper eyelid discomfort and swelling x 3 days.  He has clear eye drainage and crusting in his lashes.  He denies eye trauma, change in vision, fever, or other symptoms.  No treatments at home.  His medical history includes hypertension, Hodgkin's lymphoma, GERD, hyperlipidemia, kidney stones.  The history is provided by the patient and medical records.    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   HYPERLIPIDEMIA 10/18/2007   Qualifier: Diagnosis of  By: Jonny Ruiz MD, Rich Number, MALE 10/18/2007   Qualifier: Diagnosis of  By: Jonny Ruiz MD, Len Blalock     Patient Active Problem List   Diagnosis Date Noted   Smoker 11/29/2022   Allergic rhinitis 12/13/2020   HTN (hypertension) 11/06/2020   Encounter for well adult exam with abnormal findings 10/09/2020   Acute upper respiratory infection 04/17/2020   Cellulitis of knee 04/17/2020   Vitamin D deficiency 09/17/2019   Hand cramps 09/16/2019   Carpal tunnel syndrome 10/17/2015   Prostatitis 10/12/2013   Hodgkin's disease (HCC)    Genital herpes 06/08/2008   HYPOGONADISM, MALE 10/18/2007   HYPERLIPIDEMIA 10/18/2007   Erectile dysfunction 10/11/2007    Past Surgical History:  Procedure Laterality Date   FOOT SURGERY Left    NECK SURGERY Right 04/2011   biopsy - stage I Hodgkin's disease.   right VATS  04/2011   for organizing chronic inflammation RUL       Home Medications    Prior to Admission  medications   Medication Sig Start Date End Date Taking? Authorizing Provider  erythromycin ophthalmic ointment Place a 1/2 inch ribbon of ointment into the lower eyelid four times a day for 7 days. 04/22/23  Yes Mickie Bail, NP  amLODipine (NORVASC) 5 MG tablet Take 1 tablet by mouth once daily 12/23/22   Myrlene Broker, MD  Cholecalciferol 50 MCG (2000 UT) TABS 1 tab by mouth once daily 11/25/21   Corwin Levins, MD  naproxen (NAPROSYN) 500 MG tablet Take 1 tablet (500 mg total) by mouth 2 (two) times daily. 01/21/22   Mickie Bail, NP  pantoprazole (PROTONIX) 20 MG tablet Take 1 tablet by mouth once daily 03/23/23   Corwin Levins, MD  rosuvastatin (CRESTOR) 20 MG tablet Take 1 tablet (20 mg total) by mouth daily. 11/26/22   Corwin Levins, MD  sildenafil (VIAGRA) 100 MG tablet Take 0.5-1 tablets (50-100 mg total) by mouth daily as needed for erectile dysfunction. 12/12/21   Corwin Levins, MD  tadalafil (CIALIS) 20 MG tablet SMARTSIG:0.5-1 Tablet(s) By Mouth Every Other Day PRN 02/25/23   Corwin Levins, MD  valACYclovir (VALTREX) 1000 MG tablet Take 1 tablet (1,000 mg total) by mouth 2 (two) times daily. 11/25/21   Corwin Levins, MD  vardenafil (LEVITRA) 20 MG tablet Take 1 tablet by mouth daily as needed.  [provider]    Family History Family History  Problem Relation Age of Onset   Prostate cancer Father    Hypertension Other    Diabetes Other    Prostate cancer Paternal Grandfather    Prostate cancer Paternal Uncle    Colon cancer Neg Hx    Rectal cancer Neg Hx    Stomach cancer Neg Hx     Social History Social History   Tobacco Use   Smoking status: Every Day    Packs/day: 0.25    Years: 15.00    Additional pack years: 0.00    Total pack years: 3.75    Types: Cigarettes   Smokeless tobacco: Never  Vaping Use   Vaping Use: Never used  Substance Use Topics   Alcohol use: No   Drug use: No     Allergies   Patient has no known allergies.   Review of  Systems Review of Systems  Constitutional:  Negative for chills and fever.  HENT:  Negative for ear pain and sore throat.   Eyes:  Positive for pain and discharge. Negative for redness, itching and visual disturbance.  Respiratory:  Negative for cough and shortness of breath.   Skin:  Negative for color change, rash and wound.  All other systems reviewed and are negative.    Physical Exam Triage Vital Signs ED Triage Vitals  Enc Vitals Group     BP 04/22/23 0834 128/88     Pulse Rate 04/22/23 0820 (!) 57     Resp 04/22/23 0820 18     Temp 04/22/23 0820 98.1 F (36.7 C)     Temp src --      SpO2 04/22/23 0820 98 %     Weight --      Height --      Head Circumference --      Peak Flow --      Pain Score 04/22/23 0830 7     Pain Loc --      Pain Edu? --      Excl. in GC? --    No data found.  Updated Vital Signs BP 128/88   Pulse (!) 57   Temp 98.1 F (36.7 C)   Resp 18   SpO2 98%   Visual Acuity Right Eye Distance:   Left Eye Distance:   Bilateral Distance:    Right Eye Near:   Left Eye Near:    Bilateral Near:     Physical Exam Vitals and nursing note reviewed.  Constitutional:      General: He is not in acute distress.    Appearance: Normal appearance. He is well-developed. He is not ill-appearing.  HENT:     Right Ear: Tympanic membrane normal.     Left Ear: Tympanic membrane normal.     Nose: Nose normal.     Mouth/Throat:     Mouth: Mucous membranes are moist.     Pharynx: Oropharynx is clear.  Eyes:     General: Vision grossly intact.        Right eye: No discharge.        Left eye: No discharge.     Extraocular Movements: Extraocular movements intact.     Conjunctiva/sclera: Conjunctivae normal.     Pupils: Pupils are equal, round, and reactive to light.      Comments: Mild left upper eyelid edema.  Cardiovascular:     Rate and Rhythm: Normal rate and regular rhythm.     Heart sounds:  Normal heart sounds.  Pulmonary:     Effort:  Pulmonary effort is normal. No respiratory distress.     Breath sounds: Normal breath sounds.  Musculoskeletal:     Cervical back: Neck supple.  Skin:    General: Skin is warm and dry.  Neurological:     Mental Status: He is alert.  Psychiatric:        Mood and Affect: Mood normal.        Behavior: Behavior normal.      UC Treatments / Results  Labs (all labs ordered are listed, but only abnormal results are displayed) Labs Reviewed - No data to display  EKG   Radiology No results found.  Procedures Procedures (including critical care time)  Medications Ordered in UC Medications - No data to display  Initial Impression / Assessment and Plan / UC Course  I have reviewed the triage vital signs and the nursing notes.  Pertinent labs & imaging results that were available during my care of the patient were reviewed by me and considered in my medical decision making (see chart for details).    Hordeolum of left upper eyelid.  Treating with erythromycin eye ointment.  Education provided on stye.  Instructed patient to follow-up with his PCP if his symptoms are not improving.  ED precautions discussed.  Patient agrees to plan of care.   Final Clinical Impressions(s) / UC Diagnoses   Final diagnoses:  Hordeolum externum of left upper eyelid     Discharge Instructions      Use the antibiotic eye ointment as prescribed.    Follow-up with your primary care provider if your symptoms are not improving.    Go to the emergency department if you have acute eye pain, changes in your vision, or other concerning symptoms.        ED Prescriptions     Medication Sig Dispense Auth. Provider   erythromycin ophthalmic ointment Place a 1/2 inch ribbon of ointment into the lower eyelid four times a day for 7 days. 3.5 g Mickie Bail, NP      PDMP not reviewed this encounter.   Mickie Bail, NP 04/22/23 [phone]

## 2023-04-22 NOTE — Discharge Instructions (Addendum)
Use the antibiotic eye ointment as prescribed.    Follow-up with your primary care provider if your symptoms are not improving.    Go to the emergency department if you have acute eye pain, changes in your vision, or other concerning symptoms.    

## 2023-04-22 NOTE — ED Triage Notes (Signed)
Patient to Urgent Care with complaints of left sided upper eyelid pain and swelling that started three days ago. Reports when he wakes up he has some crusting.

## 2023-09-21 ENCOUNTER — Telehealth: Payer: Self-pay | Admitting: Internal Medicine

## 2023-09-21 ENCOUNTER — Other Ambulatory Visit: Payer: Self-pay

## 2023-09-21 MED ORDER — PANTOPRAZOLE SODIUM 20 MG PO TBEC: 20.0000 mg | DELAYED_RELEASE_TABLET | Freq: Every day | ORAL | 5 refills | Status: DC

## 2023-09-21 NOTE — Telephone Encounter (Signed)
Prescription Request  09/21/2023  LOV: 11/26/2022  What is the name of the medication or equipment? pantoprazole  Have you contacted your pharmacy to request a refill? Yes   Which pharmacy would you like this sent to?  [id number] - [place], [place] - [address] [place] [address] Phone: [phone] Fax: [phone]    Patient notified that their request is being sent to the clinical staff for review and that they should receive a response within 2 business days.   Please advise at Mobile [phone] (mobile)

## 2023-09-21 NOTE — Telephone Encounter (Signed)
Refill sent.

## 2023-11-07 ENCOUNTER — Other Ambulatory Visit: Payer: Self-pay | Admitting: Internal Medicine

## 2023-11-09 ENCOUNTER — Other Ambulatory Visit: Payer: Self-pay

## 2023-11-30 ENCOUNTER — Ambulatory Visit (INDEPENDENT_AMBULATORY_CARE_PROVIDER_SITE_OTHER): Payer: 59 | Admitting: Internal Medicine

## 2023-11-30 ENCOUNTER — Encounter: Payer: Self-pay | Admitting: Internal Medicine

## 2023-11-30 VITALS — BP 110/64 | HR 70 | Temp 98.4°F | Ht 75.0 in | Wt 270.0 lb

## 2023-11-30 DIAGNOSIS — E559 Vitamin D deficiency, unspecified: Secondary | ICD-10-CM

## 2023-11-30 DIAGNOSIS — E782 Mixed hyperlipidemia: Secondary | ICD-10-CM

## 2023-11-30 DIAGNOSIS — I10 Essential (primary) hypertension: Secondary | ICD-10-CM | POA: Diagnosis not present

## 2023-11-30 DIAGNOSIS — Z0000 Encounter for general adult medical examination without abnormal findings: Secondary | ICD-10-CM

## 2023-11-30 DIAGNOSIS — Z23 Encounter for immunization: Secondary | ICD-10-CM

## 2023-11-30 DIAGNOSIS — R739 Hyperglycemia, unspecified: Secondary | ICD-10-CM

## 2023-11-30 DIAGNOSIS — F17200 Nicotine dependence, unspecified, uncomplicated: Secondary | ICD-10-CM

## 2023-11-30 DIAGNOSIS — Z125 Encounter for screening for malignant neoplasm of prostate: Secondary | ICD-10-CM | POA: Diagnosis not present

## 2023-11-30 DIAGNOSIS — Z0001 Encounter for general adult medical examination with abnormal findings: Secondary | ICD-10-CM

## 2023-11-30 LAB — URINALYSIS, ROUTINE W REFLEX MICROSCOPIC
Bilirubin Urine: NEGATIVE
Hgb urine dipstick: NEGATIVE
Ketones, ur: NEGATIVE
Leukocytes,Ua: NEGATIVE
Nitrite: NEGATIVE
RBC / HPF: NONE SEEN (ref 0–?)
Specific Gravity, Urine: 1.025 (ref 1.000–1.030)
Total Protein, Urine: NEGATIVE
Urine Glucose: NEGATIVE
Urobilinogen, UA: 0.2 (ref 0.0–1.0)
pH: 5.5 (ref 5.0–8.0)

## 2023-11-30 LAB — BASIC METABOLIC PANEL
BUN: 15 mg/dL (ref 6–23)
CO2: 30 meq/L (ref 19–32)
Calcium: 9.6 mg/dL (ref 8.4–10.5)
Chloride: 103 meq/L (ref 96–112)
Creatinine, Ser: 0.96 mg/dL (ref 0.40–1.50)
GFR: 89.67 mL/min (ref >60.00)
Glucose, Bld: 92 mg/dL (ref 70–99)
Potassium: 4.6 meq/L (ref 3.5–5.1)
Sodium: 141 meq/L (ref 135–145)

## 2023-11-30 LAB — LIPID PANEL
Cholesterol: 142 mg/dL (ref 0–200)
HDL: 46.6 mg/dL (ref >39.00)
LDL Cholesterol: 84 mg/dL (ref 0–99)
NonHDL: 94.97
Total CHOL/HDL Ratio: 3
Triglycerides: 57 mg/dL (ref 0.0–149.0)
VLDL: 11.4 mg/dL (ref 0.0–40.0)

## 2023-11-30 LAB — HEPATIC FUNCTION PANEL
ALT: 31 U/L (ref 0–53)
AST: 23 U/L (ref 0–37)
Albumin: 4.7 g/dL (ref 3.5–5.2)
Alkaline Phosphatase: 77 U/L (ref 39–117)
Bilirubin, Direct: 0.1 mg/dL (ref 0.0–0.3)
Total Bilirubin: 0.4 mg/dL (ref 0.2–1.2)
Total Protein: 7.1 g/dL (ref 6.0–8.3)

## 2023-11-30 LAB — CBC WITH DIFFERENTIAL/PLATELET
Basophils Absolute: 0 10*3/uL (ref 0.0–0.1)
Basophils Relative: 0.7 % (ref 0.0–3.0)
Eosinophils Absolute: 0 10*3/uL (ref 0.0–0.7)
Eosinophils Relative: 0.5 % (ref 0.0–5.0)
HCT: 44.7 % (ref 39.0–52.0)
Hemoglobin: 14.8 g/dL (ref 13.0–17.0)
Lymphocytes Relative: 30.2 % (ref 12.0–46.0)
Lymphs Abs: 1.5 10*3/uL (ref 0.7–4.0)
MCHC: 33 g/dL (ref 30.0–36.0)
MCV: 89.7 fL (ref 78.0–100.0)
Monocytes Absolute: 0.4 10*3/uL (ref 0.1–1.0)
Monocytes Relative: 8 % (ref 3.0–12.0)
Neutro Abs: 3 10*3/uL (ref 1.4–7.7)
Neutrophils Relative %: 60.6 % (ref 43.0–77.0)
Platelets: 206 10*3/uL (ref 150.0–400.0)
RBC: 4.99 Mil/uL (ref 4.22–5.81)
RDW: 14.4 % (ref 11.5–15.5)
WBC: 5 10*3/uL (ref 4.0–10.5)

## 2023-11-30 LAB — PSA: PSA: 1.59 ng/mL (ref 0.10–4.00)

## 2023-11-30 LAB — TSH: TSH: 1.32 u[IU]/mL (ref 0.35–5.50)

## 2023-11-30 LAB — VITAMIN D 25 HYDROXY (VIT D DEFICIENCY, FRACTURES): VITD: 44.11 ng/mL (ref 30.00–100.00)

## 2023-11-30 LAB — HEMOGLOBIN A1C: Hgb A1c MFr Bld: 6.2 % (ref 4.6–6.5)

## 2023-11-30 MED ORDER — ROSUVASTATIN CALCIUM 40 MG PO TABS: 40.0000 mg | ORAL_TABLET | Freq: Every day | ORAL | 3 refills | Status: DC

## 2023-11-30 MED ORDER — VALACYCLOVIR HCL 1 G PO TABS: 1000.0000 mg | ORAL_TABLET | Freq: Two times a day (BID) | ORAL | 5 refills | Status: DC

## 2023-11-30 MED ORDER — SILDENAFIL CITRATE 100 MG PO TABS: 50.0000 mg | ORAL_TABLET | Freq: Every day | ORAL | 11 refills | Status: AC | PRN

## 2023-11-30 MED ORDER — AMLODIPINE BESYLATE 5 MG PO TABS: 5.0000 mg | ORAL_TABLET | Freq: Every day | ORAL | 3 refills | Status: DC

## 2023-11-30 MED ORDER — PANTOPRAZOLE SODIUM 20 MG PO TBEC: 20.0000 mg | DELAYED_RELEASE_TABLET | Freq: Every day | ORAL | 5 refills | Status: DC

## 2023-11-30 NOTE — Progress Notes (Signed)
The test results show that your current treatment is OK, as the tests are stable.  Please continue the same plan.  There is no other need for change of treatment or further evaluation based on these results, at this time.  thanks 

## 2023-11-30 NOTE — Patient Instructions (Addendum)
You had the flu shot today  Please have your Shingrix (shingles) shots done at your local pharmacy.  Please stop smoking  Ok to increase the crestor to 40 mg per day  Please continue all other medications as before  Please have the pharmacy call with any other refills you may need.  Please continue your efforts at being more active, low cholesterol diet, and weight control.  You are otherwise up to date with prevention measures today.  Please keep your appointments with your specialists as you may have planned  Please go to the LAB at the blood drawing area for the tests to be done  You will be contacted by phone if any changes need to be made immediately.  Otherwise, you will receive a letter about your results with an explanation, but please check with MyChart first.  Please make an Appointment to return for your 1 year visit, or sooner if needed

## 2023-11-30 NOTE — Progress Notes (Signed)
Patient ID: Lawrence Randolph, male   DOB: [birth year], 52 y.o.   MRN: [id number]         Chief Complaint:: wellness exam and bilat kidney stones, bilater inguinal and umbilical hernia, aortic atherosclerosis, hld, smoking       HPI:  Lawrence Randolph is a 52 y.o. male here for wellness exam; for shingrix at pharmacy, still smoking but not ready to quit, declines covid booster, for flu shot today, o/w up to date                        Also Pt denies chest pain, increased sob or doe, wheezing, orthopnea, PND, increased LE swelling, palpitations, dizziness or syncope.   Pt denies polydipsia, polyuria, or new focal neuro s/s.    Pt denies fever, night sweats, loss of appetite, or other constitutional symptoms  Had recent CT with bilat renal stone up to 3 mm, bilateral fat containing inguinal and umbilical hernias but no recent pain, and aortic atherosclerosis.  Wt down several lbs with better diet recently.   Wt Readings from Last 3 Encounters:  11/30/23 270 lb (122.5 kg)  03/25/23 277 lb 12.5 oz (126 kg)  11/26/22 278 lb (126.1 kg)   BP Readings from Last 3 Encounters:  11/30/23 110/64  04/22/23 128/88  03/25/23 (!) 147/94   Immunization History  Administered Date(s) Administered   Influenza Split 09/21/2013, 09/14/2014   Influenza, Seasonal, Injecte, Preservative Fre 11/30/2023   Influenza,inj,Quad PF,6+ Mos 09/16/2019   Influenza-Unspecified 10/14/2015, 09/25/2020, 10/28/2021, 11/19/2022   PFIZER(Purple Top)SARS-COV-2 Vaccination 04/24/2020, 05/15/2020, 10/28/2021   PNEUMOCOCCAL CONJUGATE-20 11/25/2021   Tdap 10/09/2020   Health Maintenance Due  Topic Date Due   Zoster Vaccines- Shingrix (1 of 2) Never done      Past Medical History:  Diagnosis Date   ERECTILE DYSFUNCTION 10/11/2007   Qualifier: Diagnosis of  By: Jonny Ruiz MD, Len Blalock    GENITAL HERPES 06/08/2008   Qualifier: Diagnosis of  By: Jonny Ruiz MD, Len Blalock    GERD (gastroesophageal reflux disease)    Hodgkin lymphoma (HCC) 2006   Stage  IA nodular sclerosing Hodgkin lymphoma of the right   HYPERLIPIDEMIA 10/18/2007   Qualifier: Diagnosis of  By: Jonny Ruiz MD, Rich Number, MALE 10/18/2007   Qualifier: Diagnosis of  By: Jonny Ruiz MD, Len Blalock    Past Surgical History:  Procedure Laterality Date   FOOT SURGERY Left    NECK SURGERY Right 04/2011   biopsy - stage I Hodgkin's disease.   right VATS  04/2011   for organizing chronic inflammation RUL    reports that he has been smoking cigarettes. He has a 3.8 pack-year smoking history. He has never used smokeless tobacco. He reports that he does not drink alcohol and does not use drugs. family history includes Diabetes in an other family member; Hypertension in an other family member; Prostate cancer in his father, paternal grandfather, and paternal uncle. No Known Allergies Current Outpatient Medications on File Prior to Visit  Medication Sig Dispense Refill   Cholecalciferol 50 MCG (2000 UT) TABS 1 tab by mouth once daily 30 tablet 99   erythromycin ophthalmic ointment Place a 1/2 inch ribbon of ointment into the lower eyelid four times a day for 7 days. 3.5 g 0   naproxen (NAPROSYN) 500 MG tablet Take 1 tablet (500 mg total) by mouth 2 (two) times daily. 28 tablet 0   tadalafil (CIALIS) 20 MG tablet SMARTSIG:0.5-1 Tablet(s) By  Mouth Every Other Day PRN 10 tablet 2   vardenafil (LEVITRA) 20 MG tablet Take 1 tablet by mouth daily as needed.     No current facility-administered medications on file prior to visit.        ROS:  All others reviewed and negative.  Objective        PE:  BP 110/64 (BP Location: Right Arm, Patient Position: Sitting, Cuff Size: Normal)   Pulse 70   Temp 98.4 F (36.9 C) (Oral)   Ht 6\' 3"  (1.905 m)   Wt 270 lb (122.5 kg)   SpO2 99%   BMI 33.75 kg/m                 Constitutional: Pt appears in NAD               HENT: Head: NCAT.                Right Ear: External ear normal.                 Left Ear: External ear normal.                 Eyes: . Pupils are equal, round, and reactive to light. Conjunctivae and EOM are normal               Nose: without d/c or deformity               Neck: Neck supple. Gross normal ROM               Cardiovascular: Normal rate and regular rhythm.                 Pulmonary/Chest: Effort normal and breath sounds without rales or wheezing.                Abd:  Soft, NT, ND, + BS, no organomegaly               Neurological: Pt is alert. At baseline orientation, motor grossly intact               Skin: Skin is warm. No rashes, no other new lesions, LE edema - none               Psychiatric: Pt behavior is normal without agitation   Micro: none  Cardiac tracings I have personally interpreted today:  none  Pertinent Radiological findings (summarize): none   Lab Results  Component Value Date   WBC 5.0 11/30/2023   HGB 14.8 11/30/2023   HCT 44.7 11/30/2023   PLT 206.0 11/30/2023   GLUCOSE 92 11/30/2023   CHOL 142 11/30/2023   TRIG 57.0 11/30/2023   HDL 46.60 11/30/2023   LDLDIRECT 168.0 09/09/2018   LDLCALC 84 11/30/2023   ALT 31 11/30/2023   AST 23 11/30/2023   NA 141 11/30/2023   K 4.6 11/30/2023   CL 103 11/30/2023   CREATININE 0.96 11/30/2023   BUN 15 11/30/2023   CO2 30 11/30/2023   TSH 1.32 11/30/2023   PSA 1.59 11/30/2023   HGBA1C 6.2 11/30/2023   Assessment/Plan:  Roshan Poorman Randolph is a 51 y.o. Black or African American [2] male with  has a past medical history of ERECTILE DYSFUNCTION (10/11/2007), GENITAL HERPES (06/08/2008), GERD (gastroesophageal reflux disease), Hodgkin lymphoma (HCC) (2006), HYPERLIPIDEMIA (10/18/2007), and HYPOGONADISM, MALE (10/18/2007).  Encounter for well adult exam with abnormal findings Age and sex appropriate education and counseling updated with regular exercise and diet  Referrals for preventative services - none needed Immunizations addressed - for flu shot today, declines covid booster, for shingrix at pharmacy Smoking counseling  - pt counsled  to stop, pt not ready Evidence for depression or other mood disorder - none significant Most recent labs reviewed. I have personally reviewed and have noted: 1) the patient's medical and social history 2) The patient's current medications and supplements 3) The patient's height, weight, and BMI have been recorded in the chart   HYPERLIPIDEMIA Lab Results  Component Value Date   LDLCALC 84 11/30/2023   uncontrolled, pt to increase to crestor 40 mg every day and lower chol diet  Vitamin D deficiency Last vitamin D Lab Results  Component Value Date   VD25OH 44.11 11/30/2023   Stable, cont oral replacement   HTN (hypertension) BP Readings from Last 3 Encounters:  11/30/23 110/64  04/22/23 128/88  03/25/23 (!) 147/94   Stable, pt to continue medical treatment norvasc 5 mg qd   Smoker Pt counsled to quit, pt not ready  Followup: Return in about 1 year (around 11/29/2024).  Oliver Barre, MD 12/01/2023 6:32 PM [facility]

## 2023-12-01 ENCOUNTER — Encounter: Payer: Self-pay | Admitting: Internal Medicine

## 2023-12-01 NOTE — Assessment & Plan Note (Addendum)
Lab Results  Component Value Date   LDLCALC 84 11/30/2023   uncontrolled, pt to increase to crestor 40 mg every day and lower chol diet

## 2023-12-01 NOTE — Assessment & Plan Note (Signed)
Pt counsled to quit, pt not ready °

## 2023-12-01 NOTE — Assessment & Plan Note (Signed)
BP Readings from Last 3 Encounters:  11/30/23 110/64  04/22/23 128/88  03/25/23 (!) 147/94   Stable, pt to continue medical treatment norvasc 5 mg qd

## 2023-12-01 NOTE — Assessment & Plan Note (Signed)
Age and sex appropriate education and counseling updated with regular exercise and diet Referrals for preventative services - none needed Immunizations addressed - for flu shot today, declines covid booster, for shingrix at pharmacy Smoking counseling  - pt counsled to stop, pt not ready Evidence for depression or other mood disorder - none significant Most recent labs reviewed. I have personally reviewed and have noted: 1) the patient's medical and social history 2) The patient's current medications and supplements 3) The patient's height, weight, and BMI have been recorded in the chart

## 2023-12-01 NOTE — Assessment & Plan Note (Signed)
Last vitamin D Lab Results  Component Value Date   VD25OH 44.11 11/30/2023   Stable, cont oral replacement

## 2024-03-09 ENCOUNTER — Encounter: Payer: Self-pay | Admitting: Internal Medicine

## 2024-03-09 ENCOUNTER — Ambulatory Visit: Admitting: Internal Medicine

## 2024-03-09 VITALS — BP 120/72 | HR 75 | Temp 98.2°F | Ht 75.0 in | Wt 272.0 lb

## 2024-03-09 DIAGNOSIS — K529 Noninfective gastroenteritis and colitis, unspecified: Secondary | ICD-10-CM | POA: Insufficient documentation

## 2024-03-09 DIAGNOSIS — K429 Umbilical hernia without obstruction or gangrene: Secondary | ICD-10-CM | POA: Insufficient documentation

## 2024-03-09 DIAGNOSIS — K4090 Unilateral inguinal hernia, without obstruction or gangrene, not specified as recurrent: Secondary | ICD-10-CM | POA: Diagnosis not present

## 2024-03-09 DIAGNOSIS — R8290 Unspecified abnormal findings in urine: Secondary | ICD-10-CM | POA: Insufficient documentation

## 2024-03-09 MED ORDER — ONDANSETRON 4 MG PO TBDP: 4.0000 mg | ORAL_TABLET | Freq: Three times a day (TID) | ORAL | 1 refills | Status: AC | PRN

## 2024-03-09 NOTE — Progress Notes (Signed)
 Patient ID: Lawrence Randolph, male   DOB: [birth year], 53 y.o.   MRN: [id number]        Chief Complaint: follow up CT with hernia x 2, unusual urine odor, and AGE symptoms x 3 days       HPI:  Lawrence Randolph is a 53 y.o. male here with c/o 3 days onset feverish, nausea, few vomiting without blood, crampy abd pains, and watery diarrhea multiple episodes but much improved today, though still feels weak, low stamina.  Also has an unusual urine odor strong pungent but Denies urinary symptoms such as dysuria, frequency, urgency, flank pain, hematuria or n/v, fever, chills.  Also mentions he recalls mention of 2 fat containing hernias to umbilicus and right inguinal, has not seen any swelling but given the pains of the last several days he is concerned they might be a problem  Pt denies chest pain, increased sob or doe, wheezing, orthopnea, PND, increased LE swelling, palpitations, dizziness or syncope.   Pt denies polydipsia, polyuria, or new focal neuro s/s.           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   right VATS  04/2011   for organizing chronic inflammation RUL    reports that he has  been smoking cigarettes. He has a 3.8 pack-year smoking history. He has never used smokeless tobacco. He reports that he does not drink alcohol and does not use drugs. family history includes Diabetes in an other family member; Hypertension in an other family member; Prostate cancer in his father, paternal grandfather, and paternal uncle. No Known Allergies Current Outpatient Medications on File Prior to Visit  Medication Sig Dispense Refill   amLODipine (NORVASC) 5 MG tablet Take 1 tablet (5 mg total) by mouth daily. 90 tablet 3   Cholecalciferol 50 MCG (2000 UT) TABS 1 tab by mouth once daily 30 tablet 99   erythromycin ophthalmic ointment Place a 1/2 inch ribbon of ointment into the lower eyelid four times a day for 7 days. 3.5 g 0   naproxen (NAPROSYN) 500 MG tablet Take 1 tablet (500 mg total) by mouth 2 (two) times daily. 28 tablet 0   pantoprazole (PROTONIX) 20 MG tablet Take 1 tablet (20 mg total) by mouth daily. 30 tablet 5   rosuvastatin (CRESTOR) 40 MG tablet Take 1 tablet (40 mg total) by mouth daily. 90 tablet 3   sildenafil (VIAGRA) 100 MG tablet Take 0.5-1 tablets (50-100 mg total) by mouth daily as needed for erectile  dysfunction. 5 tablet 11   tadalafil (CIALIS) 20 MG tablet SMARTSIG:0.5-1 Tablet(s) By Mouth Every Other Day PRN 10 tablet 2   valACYclovir (VALTREX) 1000 MG tablet Take 1 tablet (1,000 mg total) by mouth 2 (two) times daily. 14 tablet 5   vardenafil (LEVITRA) 20 MG tablet Take 1 tablet by mouth daily as needed.     No current facility-administered medications on file prior to visit.        ROS:  All others reviewed and negative.  Objective        PE:  BP 120/72 (BP Location: Right Arm, Patient Position: Sitting, Cuff Size: Normal)   Pulse 75   Temp 98.2 F (36.8 C) (Oral)   Ht 6\' 3"  (1.905 m)   Wt 272 lb (123.4 kg)   SpO2 98%   BMI 34.00 kg/m                 Constitutional: Pt appears in mild ill, non toxic               HENT: Head: NCAT.                 Right Ear: External ear normal.                 Left Ear: External ear normal.                Eyes: . Pupils are equal, round, and reactive to light. Conjunctivae and EOM are normal               Nose: without d/c or deformity               Neck: Neck supple. Gross normal ROM               Cardiovascular: Normal rate and regular rhythm.                 Pulmonary/Chest: Effort normal and breath sounds without rales or wheezing.                Abd:  Soft, NT, ND, + BS, no organomegaly; no umbilical or right inguinal hernias appreciated and non tender               Neurological: Pt is alert. At baseline orientation, motor grossly intact               Skin: Skin is warm. No rashes, no other new lesions, LE edema - none               Psychiatric: Pt behavior is normal without agitation   Micro: none  Cardiac tracings I have personally interpreted today:  none  Pertinent Radiological findings (summarize): none   Lab Results  Component Value Date   WBC 5.0 11/30/2023   HGB 14.8 11/30/2023   HCT 44.7 11/30/2023   PLT 206.0 11/30/2023   GLUCOSE 92 11/30/2023   CHOL 142 11/30/2023   TRIG 57.0 11/30/2023   HDL 46.60 11/30/2023   LDLDIRECT 168.0 09/09/2018   LDLCALC 84 11/30/2023   ALT 31 11/30/2023   AST 23 11/30/2023   NA 141 11/30/2023   K 4.6 11/30/2023   CL 103 11/30/2023   CREATININE 0.96 11/30/2023   BUN 15 11/30/2023   CO2 30 11/30/2023   TSH 1.32 11/30/2023   PSA 1.59 11/30/2023   HGBA1C 6.2 11/30/2023   Assessment/Plan:  Lawrence Randolph is a 53 y.o. Black or African American [2] male with  has a past medical history of ERECTILE DYSFUNCTION (10/11/2007), GENITAL HERPES (06/08/2008), GERD (gastroesophageal reflux disease), Hodgkin lymphoma (HCC) (2006), HYPERLIPIDEMIA (10/18/2007), and HYPOGONADISM, MALE (10/18/2007).  Acute gastroenteritis Mild to mod, c/w viral illness likely, now improving per pt, declines labs, for zofran prn nausea, immodium prn ,   to f/u any worsening  symptoms or concerns  Abnormal urine odor I suspect more concentrated urine causing odor, for ua and cx  Umbilical hernia Fat containing, asympt, exam benign, pt reassured, no specific eval or tx needed  Right inguinal hernia Fat containing, asympt, exam benign, pt reassured, no specific eval or tx needed  Followup: Return in about 6 months (around 09/09/2024).  Oliver Barre, MD 03/09/2024 8:11 PM [facility]

## 2024-03-09 NOTE — Assessment & Plan Note (Signed)
 Fat containing, asympt, exam benign, pt reassured, no specific eval or tx needed

## 2024-03-09 NOTE — Assessment & Plan Note (Signed)
 Mild to mod, c/w viral illness likely, now improving per pt, declines labs, for zofran prn nausea, immodium prn ,   to f/u any worsening symptoms or concerns

## 2024-03-09 NOTE — Assessment & Plan Note (Signed)
 I suspect more concentrated urine causing odor, for ua and cx

## 2024-03-09 NOTE — Patient Instructions (Addendum)
 Please take all new medication as prescribed- the medication for nausea as needed  Please continue all other medications as before, and refills have been done if requested.  Please have the pharmacy call with any other refills you may need.  Please keep your appointments with your specialists as you may have planned  We can watch the hernias for now  Please go to the LAB at the blood drawing area for the tests to be done  - just the urine testing today  You will be contacted by phone if any changes need to be made immediately.  Otherwise, you will receive a letter about your results with an explanation, but please check with MyChart first.  Please make an Appointment to return in 6 months, or sooner if needed

## 2024-03-10 ENCOUNTER — Encounter: Payer: Self-pay | Admitting: Internal Medicine

## 2024-03-10 LAB — URINALYSIS, ROUTINE W REFLEX MICROSCOPIC
Bilirubin Urine: NEGATIVE
Hgb urine dipstick: NEGATIVE
Ketones, ur: NEGATIVE
Leukocytes,Ua: NEGATIVE
Nitrite: NEGATIVE
RBC / HPF: NONE SEEN (ref 0–?)
Specific Gravity, Urine: >=1.03 — AB (ref 1.000–1.030)
Urine Glucose: NEGATIVE
Urobilinogen, UA: 0.2 (ref 0.0–1.0)
WBC, UA: NONE SEEN (ref 0–?)
pH: 6 (ref 5.0–8.0)

## 2024-03-10 LAB — URINE CULTURE: Result:: NO GROWTH

## 2024-04-19 ENCOUNTER — Ambulatory Visit: Admitting: Podiatry

## 2024-04-19 DIAGNOSIS — B999 Unspecified infectious disease: Secondary | ICD-10-CM | POA: Diagnosis not present

## 2024-04-19 MED ORDER — DOXYCYCLINE HYCLATE 100 MG PO TABS: 100.0000 mg | ORAL_TABLET | Freq: Two times a day (BID) | ORAL | 0 refills | Status: AC

## 2024-04-19 MED ORDER — TERBINAFINE HCL 250 MG PO TABS: 250.0000 mg | ORAL_TABLET | Freq: Every day | ORAL | 0 refills | Status: AC

## 2024-04-19 NOTE — Progress Notes (Signed)
 Subjective:  Patient ID: Lawrence Randolph, male    DOB: 11/03/1971,  MRN: [id number]  Chief Complaint  Patient presents with   Tinea Pedis    Pt stated that he has a lot of itching and burning on the tops of his feet he stated that he gets this place in between his toes that bother him     53 y.o. male presents with the above complaint.  Patient with bilateral fourth interdigital space superinfection.  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  Pain scale is 5 out of 10 dull aching nature.   Review of Systems: Negative except as noted in the HPI. Denies N/V/F/Ch.  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   HYPERLIPIDEMIA 10/18/2007   Qualifier: Diagnosis of  By: Autry Legions MD, Alveda Aures    HYPOGONADISM, MALE 10/18/2007   Qualifier: Diagnosis of  By: Autry Legions MD, Alveda Aures     Current Outpatient Medications:    doxycycline  (VIBRA -TABS) 100 MG tablet, Take 1 tablet (100 mg total) by mouth 2 (two) times daily., Disp: 28 tablet, Rfl: 0   terbinafine (LAMISIL) 250 MG tablet, Take 1 tablet (250 mg total) by mouth daily., Disp: 90 tablet, Rfl: 0   amLODipine  (NORVASC ) 5 MG tablet, Take 1 tablet (5 mg total) by mouth daily., Disp: 90 tablet, Rfl: 3   Cholecalciferol  50 MCG (2000 UT) TABS, 1 tab by mouth once daily, Disp: 30 tablet, Rfl: 99   erythromycin  ophthalmic ointment, Place a 1/2 inch ribbon of ointment into the lower eyelid four times a day for 7 days., Disp: 3.5 g, Rfl: 0   naproxen  (NAPROSYN ) 500 MG tablet, Take 1 tablet (500 mg total) by mouth 2 (two)  times daily., Disp: 28 tablet, Rfl: 0   ondansetron  (ZOFRAN -ODT) 4 MG disintegrating tablet, Take 1 tablet (4 mg total) by mouth every 8 (eight) hours as needed., Disp: 30 tablet, Rfl: 1   pantoprazole  (PROTONIX ) 20 MG tablet, Take 1 tablet (20 mg total) by mouth daily., Disp: 30 tablet, Rfl: 5   rosuvastatin  (CRESTOR ) 40 MG tablet, Take 1 tablet (40 mg total) by mouth daily., Disp: 90 tablet, Rfl: 3   sildenafil  (VIAGRA ) 100 MG tablet, Take 0.5-1 tablets (50-100 mg total) by mouth daily as needed for erectile dysfunction., Disp: 5 tablet, Rfl: 11   tadalafil  (CIALIS ) 20 MG tablet, SMARTSIG:0.5-1 Tablet(s) By Mouth Every Other Day PRN, Disp: 10 tablet, Rfl: 2   valACYclovir  (VALTREX ) 1000 MG tablet, Take 1 tablet (1,000 mg total) by mouth 2 (two) times daily., Disp: 14 tablet, Rfl: 5   vardenafil  (LEVITRA ) 20 MG tablet, Take 1 tablet by mouth daily as needed., Disp: , Rfl:   Social History   Tobacco Use  Smoking Status Every Day   Current packs/day: 0.25   Average packs/day: 0.3 packs/day for 15.0 years (3.8 ttl pk-yrs)   Types: Cigarettes  Smokeless Tobacco Never    No Known Allergies Objective:  There were no vitals filed for this visit. There  is no height or weight on file to calculate BMI. Constitutional Well developed. Well nourished.  Vascular Dorsalis pedis pulses palpable bilaterally. Posterior tibial pulses palpable bilaterally. Capillary refill normal to all digits.  No cyanosis or clubbing noted. Pedal hair growth normal.  Neurologic Normal speech. Oriented to person, place, and time. Epicritic sensation to light touch grossly present bilaterally.  Dermatologic Bilateral fourth interspace superinfection with subjective complaint of itching and skin epidermolysis.  Maceration noted as well.  No open wounds or lesion noted  Orthopedic: Normal joint ROM without pain or crepitus bilaterally. No visible deformities. No bony tenderness.   Radiographs: None Assessment:    1. Superinfection    Plan:  Patient was evaluated and treated and all questions answered.  Bilateral fourth interspace superinfection - All questions and concerns were discussed with the patient in extensive detail given the amount of superinfection that is present patient will benefit from doxycycline  for 14 days Lamisil for 30 days and iodine dressings in between the toes.  I encouraged him to complete the course I will see him back in 6 weeks to reevaluate he states understanding  No follow-ups on file.

## 2024-05-31 ENCOUNTER — Ambulatory Visit: Admitting: Podiatry

## 2024-10-03 ENCOUNTER — Other Ambulatory Visit: Payer: Self-pay | Admitting: Internal Medicine

## 2024-10-04 ENCOUNTER — Other Ambulatory Visit: Payer: Self-pay

## 2024-10-26 ENCOUNTER — Telehealth: Payer: Self-pay

## 2024-10-26 NOTE — Telephone Encounter (Signed)
 Copied from CRM #[id number]. Topic: Appointments - Scheduling Inquiry for Clinic >> Oct 26, 2024  3:50 PM Mesmerise C wrote: Reason for CRM: Patient stated he had a CAT scan done and was had 2 hernias inquiring if he needs to set up an appointment for that or what's the next steps moving forward patient would like a call back at [phone]

## 2024-10-27 NOTE — Telephone Encounter (Signed)
 Called and left voicemail for Pt to call office to schedule an appt & also bring a copy of the CT scan as well.

## 2024-10-27 NOTE — Telephone Encounter (Signed)
 Needs ROV as we dont have the record of the hernias, please have pt bring copy of his CT scan record    thanks

## 2024-11-09 ENCOUNTER — Other Ambulatory Visit: Payer: Self-pay | Admitting: Internal Medicine

## 2024-11-09 ENCOUNTER — Other Ambulatory Visit: Payer: Self-pay

## 2024-11-27 ENCOUNTER — Other Ambulatory Visit: Payer: Self-pay | Admitting: Internal Medicine

## 2024-11-28 ENCOUNTER — Other Ambulatory Visit: Payer: Self-pay

## 2024-12-02 ENCOUNTER — Encounter: Payer: Self-pay | Admitting: Internal Medicine

## 2024-12-02 ENCOUNTER — Ambulatory Visit: Payer: Self-pay | Admitting: Internal Medicine

## 2024-12-02 ENCOUNTER — Ambulatory Visit: Admitting: Internal Medicine

## 2024-12-02 VITALS — BP 138/84 | HR 60 | Temp 98.0°F | Ht 75.0 in | Wt 272.0 lb

## 2024-12-02 DIAGNOSIS — E782 Mixed hyperlipidemia: Secondary | ICD-10-CM

## 2024-12-02 DIAGNOSIS — E538 Deficiency of other specified B group vitamins: Secondary | ICD-10-CM | POA: Diagnosis not present

## 2024-12-02 DIAGNOSIS — N401 Enlarged prostate with lower urinary tract symptoms: Secondary | ICD-10-CM

## 2024-12-02 DIAGNOSIS — Z125 Encounter for screening for malignant neoplasm of prostate: Secondary | ICD-10-CM

## 2024-12-02 DIAGNOSIS — I10 Essential (primary) hypertension: Secondary | ICD-10-CM | POA: Diagnosis not present

## 2024-12-02 DIAGNOSIS — E559 Vitamin D deficiency, unspecified: Secondary | ICD-10-CM

## 2024-12-02 DIAGNOSIS — R351 Nocturia: Secondary | ICD-10-CM

## 2024-12-02 DIAGNOSIS — Z0000 Encounter for general adult medical examination without abnormal findings: Secondary | ICD-10-CM

## 2024-12-02 DIAGNOSIS — Z0001 Encounter for general adult medical examination with abnormal findings: Secondary | ICD-10-CM

## 2024-12-02 DIAGNOSIS — F17200 Nicotine dependence, unspecified, uncomplicated: Secondary | ICD-10-CM | POA: Diagnosis not present

## 2024-12-02 DIAGNOSIS — R739 Hyperglycemia, unspecified: Secondary | ICD-10-CM | POA: Diagnosis not present

## 2024-12-02 LAB — BASIC METABOLIC PANEL WITH GFR
BUN: 16 mg/dL (ref 6–23)
CO2: 28 meq/L (ref 19–32)
Calcium: 9.3 mg/dL (ref 8.4–10.5)
Chloride: 106 meq/L (ref 96–112)
Creatinine, Ser: 1.03 mg/dL (ref 0.40–1.50)
GFR: 81.82 mL/min
Glucose, Bld: 83 mg/dL (ref 70–99)
Potassium: 4.1 meq/L (ref 3.5–5.1)
Sodium: 140 meq/L (ref 135–145)

## 2024-12-02 LAB — HEPATIC FUNCTION PANEL
ALT: 38 U/L (ref 3–53)
AST: 31 U/L (ref 5–37)
Albumin: 4.5 g/dL (ref 3.5–5.2)
Alkaline Phosphatase: 86 U/L (ref 39–117)
Bilirubin, Direct: 0.1 mg/dL (ref 0.1–0.3)
Total Bilirubin: 0.4 mg/dL (ref 0.2–1.2)
Total Protein: 6.9 g/dL (ref 6.0–8.3)

## 2024-12-02 LAB — URINALYSIS, ROUTINE W REFLEX MICROSCOPIC
Bilirubin Urine: NEGATIVE
Hgb urine dipstick: NEGATIVE
Ketones, ur: NEGATIVE
Leukocytes,Ua: NEGATIVE
Nitrite: NEGATIVE
RBC / HPF: NONE SEEN
Specific Gravity, Urine: 1.025 (ref 1.000–1.030)
Total Protein, Urine: NEGATIVE
Urine Glucose: NEGATIVE
Urobilinogen, UA: 0.2 (ref 0.0–1.0)
pH: 6 (ref 5.0–8.0)

## 2024-12-02 LAB — CBC WITH DIFFERENTIAL/PLATELET
Basophils Absolute: 0.1 K/uL (ref 0.0–0.1)
Basophils Relative: 1 % (ref 0.0–3.0)
Eosinophils Absolute: 0.1 K/uL (ref 0.0–0.7)
Eosinophils Relative: 2.2 % (ref 0.0–5.0)
HCT: 43 % (ref 39.0–52.0)
Hemoglobin: 14.2 g/dL (ref 13.0–17.0)
Lymphocytes Relative: 46.8 % — ABNORMAL HIGH (ref 12.0–46.0)
Lymphs Abs: 2.5 K/uL (ref 0.7–4.0)
MCHC: 33.1 g/dL (ref 30.0–36.0)
MCV: 87.5 fl (ref 78.0–100.0)
Monocytes Absolute: 0.6 K/uL (ref 0.1–1.0)
Monocytes Relative: 10.9 % (ref 3.0–12.0)
Neutro Abs: 2.1 K/uL (ref 1.4–7.7)
Neutrophils Relative %: 39.1 % — ABNORMAL LOW (ref 43.0–77.0)
Platelets: 196 K/uL (ref 150.0–400.0)
RBC: 4.91 Mil/uL (ref 4.22–5.81)
RDW: 14.6 % (ref 11.5–15.5)
WBC: 5.3 K/uL (ref 4.0–10.5)

## 2024-12-02 LAB — VITAMIN D 25 HYDROXY (VIT D DEFICIENCY, FRACTURES): VITD: 40.56 ng/mL (ref 30.00–100.00)

## 2024-12-02 LAB — LIPID PANEL
Cholesterol: 104 mg/dL (ref 28–200)
HDL: 39.1 mg/dL
LDL Cholesterol: 55 mg/dL (ref 10–99)
NonHDL: 65.33
Total CHOL/HDL Ratio: 3
Triglycerides: 50 mg/dL (ref 10.0–149.0)
VLDL: 10 mg/dL (ref 0.0–40.0)

## 2024-12-02 LAB — VITAMIN B12: Vitamin B-12: 348 pg/mL (ref 211–911)

## 2024-12-02 LAB — HEMOGLOBIN A1C: Hgb A1c MFr Bld: 6.2 % (ref 4.6–6.5)

## 2024-12-02 LAB — PSA: PSA: 0.89 ng/mL (ref 0.10–4.00)

## 2024-12-02 LAB — TSH: TSH: 1.19 u[IU]/mL (ref 0.35–5.50)

## 2024-12-02 MED ORDER — TADALAFIL 20 MG PO TABS: ORAL_TABLET | ORAL | 2 refills | Status: AC

## 2024-12-02 MED ORDER — ROSUVASTATIN CALCIUM 40 MG PO TABS: 40.0000 mg | ORAL_TABLET | Freq: Every day | ORAL | 3 refills | Status: AC

## 2024-12-02 MED ORDER — PANTOPRAZOLE SODIUM 20 MG PO TBEC: 20.0000 mg | DELAYED_RELEASE_TABLET | Freq: Every day | ORAL | 3 refills | Status: AC

## 2024-12-02 MED ORDER — TAMSULOSIN HCL 0.4 MG PO CAPS: 0.4000 mg | ORAL_CAPSULE | Freq: Every day | ORAL | 3 refills | Status: AC

## 2024-12-02 MED ORDER — VALACYCLOVIR HCL 1 G PO TABS: 1000.0000 mg | ORAL_TABLET | Freq: Two times a day (BID) | ORAL | 5 refills | Status: AC

## 2024-12-02 MED ORDER — AMLODIPINE BESYLATE 5 MG PO TABS: 5.0000 mg | ORAL_TABLET | Freq: Every day | ORAL | 3 refills | Status: AC

## 2024-12-02 NOTE — Patient Instructions (Signed)
 Please take all new medication as prescribed - the flomax 1 per day  Please continue all other medications as before, and refills have been done if requested.  Please have the pharmacy call with any other refills you may need.  Please continue your efforts at being more active, low cholesterol diet, and weight control.  You are otherwise up to date with prevention measures today.  Please keep your appointments with your specialists as you may have planned  You will be contacted regarding the referral for: Urology  Please go to the LAB at the blood drawing area for the tests to be done  You will be contacted by phone if any changes need to be made immediately.  Otherwise, you will receive a letter about your results with an explanation, but please check with MyChart first.  Please make an Appointment to return for your 1 year visit, or sooner if needed

## 2024-12-02 NOTE — Progress Notes (Signed)
 The test results show that your current treatment is OK, as the tests are stable.  Please continue the same plan.  There is no other need for change of treatment or further evaluation based on these results, at this time.  thanks

## 2024-12-02 NOTE — Progress Notes (Unsigned)
 Patient ID: Lawrence Randolph, male   DOB: Sep 06, 1971, 53 y.o.   MRN: [id number]         Chief Complaint:: wellness exam and bph with nocturia, low vit d, smoker, hld, htn,        HPI:  Lawrence Randolph is a 53 y.o. male here for wellness exam; declines hep b vax and covid, for shingrix at pharmacy, o/w up to date                         Also only smoking 3 cig per wk, he is considering stopping but not yet committed to that.  Pt denies chest pain, increased sob or doe, wheezing, orthopnea, PND, increased LE swelling, palpitations, dizziness or syncope.   Pt denies polydipsia, polyuria, or new focal neuro s/s.    Pt denies fever, wt loss, night sweats, loss of appetite, or other constitutional symptoms   Also has  urinary urgency frequency with weak stream and nocturia x 3. Wt Readings from Last 3 Encounters:  12/02/24 272 lb (123.4 kg)  03/09/24 272 lb (123.4 kg)  11/30/23 270 lb (122.5 kg)   BP Readings from Last 3 Encounters:  12/02/24 138/84  03/09/24 120/72  11/30/23 110/64   Immunization History  Administered Date(s) Administered   Influenza Split 09/21/2013, 09/14/2014   Influenza, Seasonal, Injecte, Preservative Fre 11/30/2023   Influenza,inj,Quad PF,6+ Mos 09/16/2019, 10/29/2024   Influenza-Unspecified 10/14/2015, 09/25/2020, 10/28/2021, 11/19/2022   PFIZER(Purple Top)SARS-COV-2 Vaccination 04/24/2020, 05/15/2020, 10/28/2021   PNEUMOCOCCAL CONJUGATE-20 11/25/2021   Tdap 10/09/2020   Health Maintenance Due  Topic Date Due   Hepatitis B Vaccines 19-59 Average Risk (1 of 3 - 19+ 3-dose series) Never done   Zoster Vaccines- Shingrix (1 of 2) Never done      Past Medical History:  Diagnosis Date   ERECTILE DYSFUNCTION 10/11/2007   Qualifier: Diagnosis of  By: Norleen MD, Lynwood ORN    GENITAL HERPES 06/08/2008   Qualifier: Diagnosis of  By: Norleen MD, Lynwood ORN    GERD (gastroesophageal reflux disease)    Hodgkin lymphoma (HCC) 2006   Stage IA nodular sclerosing Hodgkin lymphoma of the  right   HYPERLIPIDEMIA 10/18/2007   Qualifier: Diagnosis of  By: Norleen MD, Lynwood ORN Lawrence Randolph, MALE 10/18/2007   Qualifier: Diagnosis of  By: Norleen MD, Lynwood ORN    Past Surgical History:  Procedure Laterality Date   FOOT SURGERY Left    NECK SURGERY Right 04/2011   biopsy - stage I Hodgkin's disease.   right VATS  04/2011   for organizing chronic inflammation RUL    reports that he has been smoking cigarettes. He has a 3.8 pack-year smoking history. He has never used smokeless tobacco. He reports that he does not drink alcohol and does not use drugs. family history includes Diabetes in an other family member; Hypertension in an other family member; Prostate cancer in his father, paternal grandfather, and paternal uncle. Allergies[1] Medications Ordered Prior to Encounter[2]      ROS:  All others reviewed and negative.  Objective        PE:  BP 138/84 (BP Location: Right Arm, Patient Position: Sitting, Cuff Size: Normal)   Pulse 60   Temp 98 F (36.7 C) (Oral)   Ht 6' 3 (1.905 m)   Wt 272 lb (123.4 kg)   SpO2 99%   BMI 34.00 kg/m  Constitutional: Pt appears in NAD               HENT: Head: NCAT.                Right Ear: External ear normal.                 Left Ear: External ear normal.                Eyes: . Pupils are equal, round, and reactive to light. Conjunctivae and EOM are normal               Nose: without d/c or deformity               Neck: Neck supple. Gross normal ROM               Cardiovascular: Normal rate and regular rhythm.                 Pulmonary/Chest: Effort normal and breath sounds without rales or wheezing.                Abd:  Soft, NT, ND, + BS, no organomegaly               Neurological: Pt is alert. At baseline orientation, motor grossly intact               Skin: Skin is warm. No rashes, no other new lesions, LE edema - none               Psychiatric: Pt behavior is normal without agitation   Micro: none  Cardiac tracings  I have personally interpreted today:  none  Pertinent Radiological findings (summarize): none   Lab Results  Component Value Date   WBC 5.3 12/02/2024   HGB 14.2 12/02/2024   HCT 43.0 12/02/2024   PLT 196.0 12/02/2024   GLUCOSE 83 12/02/2024   CHOL 104 12/02/2024   TRIG 50.0 12/02/2024   HDL 39.10 12/02/2024   LDLDIRECT 168.0 09/09/2018   LDLCALC 55 12/02/2024   ALT 38 12/02/2024   AST 31 12/02/2024   NA 140 12/02/2024   K 4.1 12/02/2024   CL 106 12/02/2024   CREATININE 1.03 12/02/2024   BUN 16 12/02/2024   CO2 28 12/02/2024   TSH 1.19 12/02/2024   PSA 0.89 12/02/2024   HGBA1C 6.2 12/02/2024   Assessment/Plan:  Humzah Harty Cockerell is a 53 y.o. Black or African American [2] male with  has a past medical history of ERECTILE DYSFUNCTION (10/11/2007), GENITAL HERPES (06/08/2008), GERD (gastroesophageal reflux disease), Hodgkin lymphoma (HCC) (2006), HYPERLIPIDEMIA (10/18/2007), and HYPOGONADISM, MALE (10/18/2007).  Encounter for well adult exam with abnormal findings Age and sex appropriate education and counseling updated with regular exercise and diet Referrals for preventative services - none needed Immunizations addressed - declines hep b  and covid, for shingrix at pharmacy Smoking counseling  - pt counsled to quit, pt not ready to quit Evidence for depression or other mood disorder - none significant Most recent labs reviewed. I have personally reviewed and have noted: 1) the patient's medical and social history 2) The patient's current medications and supplements 3) The patient's height, weight, and BMI have been recorded in the chart   Vitamin D  deficiency Last vitamin D  Lab Results  Component Value Date   VD25OH 40.56 12/02/2024   Stable, cont oral replacement   Smoker Pt counsled to quit, pt not ready  HYPERLIPIDEMIA Lab Results  Component Value Date  LDLCALC 55 12/02/2024   Stable, pt to continue current statin crestor  40 mg qd   HTN (hypertension) BP  Readings from Last 3 Encounters:  12/02/24 138/84  03/09/24 120/72  11/30/23 110/64   Stable, pt to continue medical treatment norvasc  5 mg qd   BPH associated with nocturia Mild to mod, for flomax  1 every day, refer urology,  to f/u any worsening symptoms or concerns  Followup: Return in about 1 year (around 12/02/2025).  Lynwood Rush, MD 12/03/2024 8:21 PM [facility]    [1] No Known Allergies [2]  Current Outpatient Medications on File Prior to Visit  Medication Sig Dispense Refill   Cholecalciferol  50 MCG (2000 UT) TABS 1 tab by mouth once daily 30 tablet 99   doxycycline  (VIBRA -TABS) 100 MG tablet Take 1 tablet (100 mg total) by mouth 2 (two) times daily. 28 tablet 0   erythromycin  ophthalmic ointment Place a 1/2 inch ribbon of ointment into the lower eyelid four times a day for 7 days. 3.5 g 0   naproxen  (NAPROSYN ) 500 MG tablet Take 1 tablet (500 mg total) by mouth 2 (two) times daily. 28 tablet 0   ondansetron  (ZOFRAN -ODT) 4 MG disintegrating tablet Take 1 tablet (4 mg total) by mouth every 8 (eight) hours as needed. 30 tablet 1   sildenafil  (VIAGRA ) 100 MG tablet Take 0.5-1 tablets (50-100 mg total) by mouth daily as needed for erectile dysfunction. 5 tablet 11   terbinafine  (LAMISIL ) 250 MG tablet Take 1 tablet (250 mg total) by mouth daily. 90 tablet 0   vardenafil  (LEVITRA ) 20 MG tablet Take 1 tablet by mouth daily as needed.     No current facility-administered medications on file prior to visit.

## 2024-12-03 ENCOUNTER — Encounter: Payer: Self-pay | Admitting: Internal Medicine

## 2024-12-03 NOTE — Assessment & Plan Note (Signed)
 Lab Results  Component Value Date   LDLCALC 55 12/02/2024   Stable, pt to continue current statin crestor  40 mg qd

## 2024-12-03 NOTE — Assessment & Plan Note (Signed)
 BP Readings from Last 3 Encounters:  12/02/24 138/84  03/09/24 120/72  11/30/23 110/64   Stable, pt to continue medical treatment norvasc  5 mg qd

## 2024-12-03 NOTE — Assessment & Plan Note (Signed)
 Last vitamin D  Lab Results  Component Value Date   VD25OH 40.56 12/02/2024   Stable, cont oral replacement

## 2024-12-03 NOTE — Assessment & Plan Note (Signed)
 Pt counsled to quit, pt not ready

## 2024-12-03 NOTE — Assessment & Plan Note (Signed)
 Mild to mod, for flomax  1 every day, refer urology,  to f/u any worsening symptoms or concerns

## 2024-12-03 NOTE — Assessment & Plan Note (Signed)
 Age and sex appropriate education and counseling updated with regular exercise and diet Referrals for preventative services - none needed Immunizations addressed - declines hep b  and covid, for shingrix at pharmacy Smoking counseling  - pt counsled to quit, pt not ready to quit Evidence for depression or other mood disorder - none significant Most recent labs reviewed. I have personally reviewed and have noted: 1) the patient's medical and social history 2) The patient's current medications and supplements 3) The patient's height, weight, and BMI have been recorded in the chart
